# Patient Record
Sex: Male | Born: 1950 | Race: White | Hispanic: No | Marital: Married | Smoking: Former smoker
Health system: Southern US, Community
[De-identification: ages and names within clinical notes are randomized; demographics above are authoritative.]

## PROBLEM LIST (undated history)

## (undated) DIAGNOSIS — K4090 Unilateral inguinal hernia, without obstruction or gangrene, not specified as recurrent: Secondary | ICD-10-CM

## (undated) DIAGNOSIS — H269 Unspecified cataract: Secondary | ICD-10-CM

## (undated) DIAGNOSIS — F32A Depression, unspecified: Secondary | ICD-10-CM

## (undated) DIAGNOSIS — F419 Anxiety disorder, unspecified: Secondary | ICD-10-CM

## (undated) DIAGNOSIS — Z973 Presence of spectacles and contact lenses: Secondary | ICD-10-CM

## (undated) DIAGNOSIS — E079 Disorder of thyroid, unspecified: Secondary | ICD-10-CM

## (undated) DIAGNOSIS — F329 Major depressive disorder, single episode, unspecified: Secondary | ICD-10-CM

## (undated) DIAGNOSIS — E039 Hypothyroidism, unspecified: Secondary | ICD-10-CM

## (undated) DIAGNOSIS — I10 Essential (primary) hypertension: Secondary | ICD-10-CM

## (undated) DIAGNOSIS — T4145XA Adverse effect of unspecified anesthetic, initial encounter: Secondary | ICD-10-CM

## (undated) DIAGNOSIS — G4730 Sleep apnea, unspecified: Secondary | ICD-10-CM

## (undated) DIAGNOSIS — M1711 Unilateral primary osteoarthritis, right knee: Secondary | ICD-10-CM

## (undated) DIAGNOSIS — T8859XA Other complications of anesthesia, initial encounter: Secondary | ICD-10-CM

## (undated) HISTORY — PX: COLONOSCOPY: SHX5424

## (undated) HISTORY — PX: OTHER SURGICAL HISTORY: SHX169

---
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
## 1987-01-22 HISTORY — PX: KNEE ARTHROSCOPY W/ ACL RECONSTRUCTION: SHX1858

## 2009-03-17 ENCOUNTER — Encounter
Admission: RE | Admit: 2009-03-17 | Discharge: 2009-05-02 | Payer: Self-pay | Source: Home / Self Care | Admitting: Family Medicine

## 2016-05-27 DIAGNOSIS — M1711 Unilateral primary osteoarthritis, right knee: Secondary | ICD-10-CM | POA: Diagnosis not present

## 2016-05-31 DIAGNOSIS — M7022 Olecranon bursitis, left elbow: Secondary | ICD-10-CM | POA: Diagnosis not present

## 2016-05-31 DIAGNOSIS — S50312A Abrasion of left elbow, initial encounter: Secondary | ICD-10-CM | POA: Diagnosis not present

## 2016-06-11 DIAGNOSIS — F419 Anxiety disorder, unspecified: Secondary | ICD-10-CM | POA: Diagnosis not present

## 2016-06-20 DIAGNOSIS — F419 Anxiety disorder, unspecified: Secondary | ICD-10-CM | POA: Diagnosis not present

## 2016-07-11 DIAGNOSIS — F419 Anxiety disorder, unspecified: Secondary | ICD-10-CM | POA: Diagnosis not present

## 2016-07-16 DIAGNOSIS — F419 Anxiety disorder, unspecified: Secondary | ICD-10-CM | POA: Diagnosis not present

## 2016-07-22 DIAGNOSIS — F419 Anxiety disorder, unspecified: Secondary | ICD-10-CM | POA: Diagnosis not present

## 2016-08-12 DIAGNOSIS — F419 Anxiety disorder, unspecified: Secondary | ICD-10-CM | POA: Diagnosis not present

## 2016-08-13 DIAGNOSIS — F419 Anxiety disorder, unspecified: Secondary | ICD-10-CM | POA: Diagnosis not present

## 2016-08-20 DIAGNOSIS — H2513 Age-related nuclear cataract, bilateral: Secondary | ICD-10-CM | POA: Diagnosis not present

## 2016-08-27 DIAGNOSIS — F419 Anxiety disorder, unspecified: Secondary | ICD-10-CM | POA: Diagnosis not present

## 2016-09-12 DIAGNOSIS — M1711 Unilateral primary osteoarthritis, right knee: Secondary | ICD-10-CM | POA: Diagnosis not present

## 2016-09-24 DIAGNOSIS — F419 Anxiety disorder, unspecified: Secondary | ICD-10-CM | POA: Diagnosis not present

## 2016-10-02 DIAGNOSIS — M25561 Pain in right knee: Secondary | ICD-10-CM | POA: Diagnosis not present

## 2016-10-02 DIAGNOSIS — I10 Essential (primary) hypertension: Secondary | ICD-10-CM | POA: Diagnosis not present

## 2016-10-08 DIAGNOSIS — F419 Anxiety disorder, unspecified: Secondary | ICD-10-CM | POA: Diagnosis not present

## 2016-10-09 ENCOUNTER — Encounter (HOSPITAL_COMMUNITY): Payer: Self-pay | Admitting: Physician Assistant

## 2016-10-09 DIAGNOSIS — E079 Disorder of thyroid, unspecified: Secondary | ICD-10-CM | POA: Diagnosis present

## 2016-10-09 DIAGNOSIS — M1711 Unilateral primary osteoarthritis, right knee: Secondary | ICD-10-CM | POA: Diagnosis present

## 2016-10-09 DIAGNOSIS — F329 Major depressive disorder, single episode, unspecified: Secondary | ICD-10-CM | POA: Diagnosis present

## 2016-10-09 DIAGNOSIS — F32A Depression, unspecified: Secondary | ICD-10-CM | POA: Diagnosis present

## 2016-10-09 DIAGNOSIS — F419 Anxiety disorder, unspecified: Secondary | ICD-10-CM | POA: Diagnosis present

## 2016-10-09 DIAGNOSIS — I10 Essential (primary) hypertension: Secondary | ICD-10-CM | POA: Diagnosis present

## 2016-10-09 NOTE — Pre-Procedure Instructions (Signed)
    Christian Snyder  10/09/2016      [facility] [place], [place] - [address] [place] [address] Phone: [phone] Fax: [phone]    Your procedure is scheduled on October 21, 2016.  Report to [facility] Admitting at [phone] AM.  Call this number if you have problems the morning of surgery:  [phone]   Remember:  Do not eat food or drink liquids after midnight.  Take these medicines the morning of surgery with A SIP OF WATER amlodipine (norvasc), bupropion (wellbutrin), lamotrigine (lamictal), levothyroxine (synthroid).   Do not wear jewelry, make-up or nail polish.  Do not wear lotions, powders, or perfumes, or deoderant.  Men may shave face and neck.  Do not bring valuables to the hospital.  [facility] is not responsible for any belongings or valuables.  Contacts, dentures or bridgework may not be worn into surgery.  Leave your suitcase in the car.  After surgery it may be brought to your room.  For patients admitted to the hospital, discharge time will be determined by your treatment team.  Patients discharged the day of surgery will not be allowed to drive home.   Special instructions:   [place]- Preparing For Surgery  Before surgery, you can play an important role. Because skin is not sterile, your skin needs to be as free of germs as possible. You can reduce the number of germs on your skin by washing with CHG (chlorahexidine gluconate) Soap before surgery.  CHG is an antiseptic cleaner which kills germs and bonds with the skin to continue killing germs even after washing.  Please do not use if you have an allergy to CHG or antibacterial soaps. If your skin becomes reddened/irritated stop using the CHG.  Do not shave (including legs and underarms) for at least 48 hours prior to first CHG shower. It is OK to shave your face.  Please follow these instructions carefully.   1. Shower the NIGHT BEFORE  SURGERY and the MORNING OF SURGERY with CHG.   2. If you chose to wash your hair, wash your hair first as usual with your normal shampoo.  3. After you shampoo, rinse your hair and body thoroughly to remove the shampoo.  4. Use CHG as you would any other liquid soap. You can apply CHG directly to the skin and wash gently with a scrungie or a clean washcloth.   5. Apply the CHG Soap to your body ONLY FROM THE NECK DOWN.  Do not use on open wounds or open sores. Avoid contact with your eyes, ears, mouth and genitals (private parts). Wash genitals (private parts) with your normal soap.  6. Wash thoroughly, paying special attention to the area where your surgery will be performed.  7. Thoroughly rinse your body with warm water from the neck down.  8. DO NOT shower/wash with your normal soap after using and rinsing off the CHG Soap.  9. Pat yourself dry with a CLEAN TOWEL.   10. Wear CLEAN PAJAMAS   11. Place CLEAN SHEETS on your bed the night of your first shower and DO NOT SLEEP WITH PETS.    Day of Surgery: Do not apply any deodorants/lotions. Please wear clean clothes to the hospital/surgery center.     Please read over the following fact sheets that you were given. Pain Booklet, Coughing and Deep Breathing, MRSA Information and Surgical Site Infection Prevention

## 2016-10-09 NOTE — H&P (Signed)
TOTAL KNEE ADMISSION H&P  Patient is being admitted for right total knee arthroplasty.  Subjective:  Chief Complaint:right knee pain.  HPI: Christian Snyder, 66 y.o. male, has a history of pain and functional disability in the right knee due to arthritis and has failed non-surgical conservative treatments for greater than 12 weeks to includeNSAID's and/or analgesics, corticosteriod injections, viscosupplementation injections, flexibility and strengthening excercises, supervised PT with diminished ADL's post treatment, use of assistive devices, weight reduction as appropriate and activity modification.  Onset of symptoms was gradual, starting 10 years ago with gradually worsening course since that time. The patient noted prior procedures on the knee to include  arthroscopy, menisectomy and ACL reconstruction on the right knee(s).  Patient currently rates pain in the right knee(s) at 10 out of 10 with activity. Patient has night pain, worsening of pain with activity and weight bearing, pain that interferes with activities of daily living, crepitus and joint swelling.  Patient has evidence of subchondral sclerosis, periarticular osteophytes and joint space narrowing by imaging studies. There is no active infection.  Patient Active Problem List   Diagnosis Date Noted  . Essential hypertension   . Thyroid disease   . Depression   . Anxiety   . Primary localized osteoarthritis of right knee    Past Medical History:  Diagnosis Date  . Anxiety   . Depression   . Essential hypertension   . Primary localized osteoarthritis of right knee   . Thyroid disease     Past Surgical History:  Procedure Laterality Date  . KNEE ARTHROSCOPY W/ ACL RECONSTRUCTION Right 1989   4 procedures related to this   . TONSILLECTOMY  1960    No current facility-administered medications for this encounter.   Current Outpatient Prescriptions:  .  amLODipine (NORVASC) 5 MG tablet, Take 10 mg by mouth daily. , Disp: , Rfl:   .  buPROPion (WELLBUTRIN XL) 300 MG 24 hr tablet, Take 300 mg by mouth daily., Disp: , Rfl:  .  ibuprofen (ADVIL,MOTRIN) 200 MG tablet, Take 200-400 mg by mouth every 8 (eight) hours as needed for mild pain (depends on pain if takes 1-2 tablets)., Disp: , Rfl:  .  lamoTRIgine (LAMICTAL) 25 MG tablet, Take 50 mg by mouth daily., Disp: , Rfl:  .  levothyroxine (SYNTHROID, LEVOTHROID) 112 MCG tablet, Take 112 mcg by mouth daily before breakfast., Disp: , Rfl:  .  lisinopril (PRINIVIL,ZESTRIL) 40 MG tablet, Take 40 mg by mouth daily., Disp: , Rfl:  .  traZODone (DESYREL) 100 MG tablet, Take 100 mg by mouth at bedtime as needed for sleep., Disp: , Rfl:  .  Glucosamine HCl (GLUCOSAMINE PO), Take 2,000 mg by mouth daily., Disp: , Rfl:  .  Multiple Vitamin (MULTIVITAMIN WITH MINERALS) TABS tablet, Take 1 tablet by mouth daily., Disp: , Rfl:  .  Omega-3 Fatty Acids (FISH OIL) 1000 MG CAPS, Take 1,000 mg by mouth daily., Disp: , Rfl:  .  Red Yeast Rice 600 MG CAPS, Take 600 mg by mouth daily., Disp: , Rfl:  Allergies  Allergen Reactions  . Penicillins Hives    Has patient had a PCN reaction causing immediate rash, facial/tongue/throat swelling, SOB or lightheadedness with hypotension: Unknown Has patient had a PCN reaction causing severe rash involving mucus membranes or skin necrosis: Unknown Has patient had a PCN reaction that required hospitalization: Unknown Has patient had a PCN reaction occurring within the last 10 years: No If all of the above answers are "NO", then may proceed  with Cephalosporin use.     Social History  Substance Use Topics  . Smoking status: Former Smoker    Start date: 1971    Quit date: 2017  . Smokeless tobacco: Never Used  . Alcohol use Yes     Comment: 2 drinks    Family History  Problem Relation Age of Onset  . Hypertension Father      Review of Systems  Constitutional: Negative.   HENT: Positive for congestion and sinus pain.   Eyes: Negative.    Respiratory: Negative.   Cardiovascular: Negative.   Gastrointestinal: Negative.   Musculoskeletal: Positive for back pain and joint pain.  Skin: Negative.   Neurological: Negative.   Endo/Heme/Allergies: Negative.   Psychiatric/Behavioral: Negative.     Objective:  Physical Exam  Constitutional: He is oriented to person, place, and time. He appears well-developed and well-nourished.  HENT:  Head: Normocephalic and atraumatic.  Mouth/Throat: Oropharynx is clear and moist.  Eyes: Pupils are equal, round, and reactive to light. Conjunctivae are normal.  Cardiovascular: Normal rate and regular rhythm.   Respiratory: Effort normal and breath sounds normal.  GI: Soft. Bowel sounds are normal.  Genitourinary:  Genitourinary Comments: Not pertinent to current symptomatology therefore not examined.  Musculoskeletal:  Examination of his right knee reveals pain medially and laterally.  1+ synovitis.  1+ crepitation.  Previous well healed ACL incision.  Mild varus deformity.  Range of motion from 0-120 degrees.  Knee is stable with normal patella tracking.  Examination of his left knee reveals full range of motion without pain, swelling, weakness or instability.  Vascular exam: Pulses are 2+ and symmetric.  Neurologic exam: Distal motor and sensory examination is within normal limits.    Neurological: He is alert and oriented to person, place, and time.  Skin: Skin is warm and dry.  Psychiatric: He has a normal mood and affect. His behavior is normal.    Vital signs in last 24 hours: Temp:  [97.9 F (36.6 C)] 97.9 F (36.6 C) (09/19 1400) Pulse Rate:  [106] 106 (09/19 1400) BP: (182)/(95) 182/95 (09/19 1400) SpO2:  [97 %] 97 % (09/19 1400) Weight:  [99.8 kg (220 lb)] 99.8 kg (220 lb) (09/19 1400)  Labs:   Estimated body mass index is 27.5 kg/m as calculated from the following:   Height as of this encounter:  (1.905 m).   Weight as of this encounter: 99.8 kg (220  lb).   Imaging Review Plain radiographs demonstrate severe degenerative joint disease of the right knee(s). The overall alignment issignificant varus. The bone quality appears to be good for age and reported activity level.  Assessment/Plan:  End stage arthritis, right knee   The patient history, physical examination, clinical judgment of the provider and imaging studies are consistent with end stage degenerative joint disease of the right knee(s) and total knee arthroplasty is deemed medically necessary. The treatment options including medical management, injection therapy arthroscopy and arthroplasty were discussed at length. The risks and benefits of total knee arthroplasty were presented and reviewed. The risks due to aseptic loosening, infection, stiffness, patella tracking problems, thromboembolic complications and other imponderables were discussed. The patient acknowledged the explanation, agreed to proceed with the plan and consent was signed. Patient is being admitted for inpatient treatment for surgery, pain control, PT, OT, prophylactic antibiotics, VTE prophylaxis, progressive ambulation and ADL's and discharge planning. The patient is planning to be discharged home with home health services

## 2016-10-10 ENCOUNTER — Encounter (HOSPITAL_COMMUNITY)
Admission: RE | Admit: 2016-10-10 | Discharge: 2016-10-10 | Disposition: A | Discharge status: Home / Self Care | Payer: Medicare Other | Source: Ambulatory Visit | Attending: Orthopedic Surgery | Admitting: Orthopedic Surgery

## 2016-10-10 ENCOUNTER — Ambulatory Visit (HOSPITAL_COMMUNITY)
Admission: RE | Admit: 2016-10-10 | Discharge: 2016-10-10 | Disposition: A | Discharge status: Home / Self Care | Payer: Medicare Other | Source: Ambulatory Visit | Attending: Physician Assistant | Admitting: Physician Assistant

## 2016-10-10 ENCOUNTER — Encounter (HOSPITAL_COMMUNITY): Payer: Self-pay

## 2016-10-10 DIAGNOSIS — Z01818 Encounter for other preprocedural examination: Secondary | ICD-10-CM | POA: Diagnosis not present

## 2016-10-10 DIAGNOSIS — R918 Other nonspecific abnormal finding of lung field: Secondary | ICD-10-CM | POA: Diagnosis not present

## 2016-10-10 DIAGNOSIS — IMO0001 Reserved for inherently not codable concepts without codable children: Secondary | ICD-10-CM

## 2016-10-10 DIAGNOSIS — J8410 Pulmonary fibrosis, unspecified: Secondary | ICD-10-CM | POA: Diagnosis not present

## 2016-10-10 HISTORY — DX: Sleep apnea, unspecified: G47.30

## 2016-10-10 LAB — COMPREHENSIVE METABOLIC PANEL
ALT: 32 U/L (ref 17–63)
AST: 33 U/L (ref 15–41)
Albumin: 4.2 g/dL (ref 3.5–5.0)
Alkaline Phosphatase: 89 U/L (ref 38–126)
Anion gap: 8 (ref 5–15)
BILIRUBIN TOTAL: 0.6 mg/dL (ref 0.3–1.2)
BUN: 14 mg/dL (ref 6–20)
CALCIUM: 9.7 mg/dL (ref 8.9–10.3)
CHLORIDE: 104 mmol/L (ref 101–111)
CO2: 26 mmol/L (ref 22–32)
CREATININE: 1.26 mg/dL — AB (ref 0.61–1.24)
GFR, EST NON AFRICAN AMERICAN: 58 mL/min — AB (ref >60)
Glucose, Bld: 112 mg/dL — ABNORMAL HIGH (ref 65–99)
Potassium: 4 mmol/L (ref 3.5–5.1)
Sodium: 138 mmol/L (ref 135–145)
TOTAL PROTEIN: 7.7 g/dL (ref 6.5–8.1)

## 2016-10-10 LAB — CBC WITH DIFFERENTIAL/PLATELET
Basophils Absolute: 0.1 10*3/uL (ref 0.0–0.1)
Basophils Relative: 1 %
EOS PCT: 2 %
Eosinophils Absolute: 0.2 10*3/uL (ref 0.0–0.7)
HEMATOCRIT: 38.7 % — AB (ref 39.0–52.0)
Hemoglobin: 12.5 g/dL — ABNORMAL LOW (ref 13.0–17.0)
LYMPHS ABS: 4.2 10*3/uL — AB (ref 0.7–4.0)
Lymphocytes Relative: 33 %
MCH: 31.1 pg (ref 26.0–34.0)
MCHC: 32.3 g/dL (ref 30.0–36.0)
MCV: 96.3 fL (ref 78.0–100.0)
MONO ABS: 1.1 10*3/uL — AB (ref 0.1–1.0)
Monocytes Relative: 9 %
NEUTROS ABS: 7.4 10*3/uL (ref 1.7–7.7)
Neutrophils Relative %: 57 %
PLATELETS: 431 10*3/uL — AB (ref 150–400)
RBC: 4.02 MIL/uL — AB (ref 4.22–5.81)
RDW: 13.5 % (ref 11.5–15.5)
WBC: 13 10*3/uL — AB (ref 4.0–10.5)

## 2016-10-10 LAB — SURGICAL PCR SCREEN
MRSA, PCR: NEGATIVE
STAPHYLOCOCCUS AUREUS: NEGATIVE

## 2016-10-10 LAB — TYPE AND SCREEN
ABO/RH(D): O NEG
ANTIBODY SCREEN: NEGATIVE

## 2016-10-10 LAB — ABO/RH: ABO/RH(D): O NEG

## 2016-10-10 LAB — APTT: aPTT: 34 seconds (ref 24–36)

## 2016-10-10 LAB — PROTIME-INR
INR: 1.01
PROTHROMBIN TIME: 13.2 s (ref 11.4–15.2)

## 2016-10-10 NOTE — Progress Notes (Addendum)
PCP - Catha Gosselin Cardiologist - denies  Chest x-ray -10/10/16 EKG - requesting Stress Test - denies ECHO - denies Cardiac Cath - denies  Sleep Study - requesting from 3 years ago CPAP - wears at night knows to bring mask and tubing  Sending to anesthesia for review Patient is being treated for recent nasal congestion with doxycycline has about 10 days left with antibiotic. I called [facility]'s office and left message to make sure they are aware of his recent    Patient denies shortness of breath, fever, cough and chest pain at PAT appointment   Patient verbalized understanding of instructions that were given to them at the PAT appointment. Patient was also instructed that they will need to review over the PAT instructions again at home before surgery.

## 2016-10-11 LAB — URINE CULTURE: CULTURE: NO GROWTH

## 2016-11-14 DIAGNOSIS — F419 Anxiety disorder, unspecified: Secondary | ICD-10-CM | POA: Diagnosis not present

## 2016-11-20 DIAGNOSIS — M1711 Unilateral primary osteoarthritis, right knee: Secondary | ICD-10-CM | POA: Diagnosis not present

## 2016-11-20 NOTE — H&P (Signed)
TOTAL KNEE ADMISSION H&P  Patient is being admitted for right total knee arthroplasty.  Subjective:  Chief Complaint:right knee pain.  HPI: Christian Snyder, 66 y.o. male, has a history of pain and functional disability in the right knee due to arthritis and has failed non-surgical conservative treatments for greater than 12 weeks to includeNSAID's and/or analgesics, corticosteriod injections, viscosupplementation injections, flexibility and strengthening excercises, supervised PT with diminished ADL's post treatment, use of assistive devices, weight reduction as appropriate and activity modification.  Onset of symptoms was gradual, starting 10 years ago with gradually worsening course since that time. The patient noted prior procedures on the knee to include  arthroscopy, menisectomy and ACL reconstruction on the right knee(s).  Patient currently rates pain in the right knee(s) at 10 out of 10 with activity. Patient has night pain, worsening of pain with activity and weight bearing, pain that interferes with activities of daily living, crepitus and joint swelling.  Patient has evidence of subchondral sclerosis, periarticular osteophytes and joint space narrowing by imaging studies.  There is no active infection.  Patient Active Problem List   Diagnosis Date Noted  . Essential hypertension   . Thyroid disease   . Depression   . Anxiety   . Primary localized osteoarthritis of right knee    Past Medical History:  Diagnosis Date  . Anxiety   . Depression   . Essential hypertension   . Primary localized osteoarthritis of right knee   . Sleep apnea    [facility] dr. [name] wears CPAP at night  . Thyroid disease     Past Surgical History:  Procedure Laterality Date  . COLONOSCOPY     2x  . KNEE ARTHROSCOPY W/ ACL RECONSTRUCTION Right 1989   4 procedures related to this   . TONSILLECTOMY  1960    No current facility-administered medications for this encounter.    Current  Outpatient Prescriptions  Medication Sig Dispense Refill Last Dose  . amLODipine (NORVASC) 5 MG tablet Take 10 mg by mouth daily.    10/09/2016 at Unknown time  . buPROPion (WELLBUTRIN XL) 300 MG 24 hr tablet Take 300 mg by mouth daily.   10/09/2016 at Unknown time  . ibuprofen (ADVIL,MOTRIN) 200 MG tablet Take 200-400 mg by mouth every 8 (eight) hours as needed for mild pain (depends on pain if takes 1-2 tablets).     . lamoTRIgine (LAMICTAL) 25 MG tablet Take 50 mg by mouth daily.   10/09/2016 at Unknown time  . levothyroxine (SYNTHROID, LEVOTHROID) 112 MCG tablet Take 112 mcg by mouth daily before breakfast.   10/09/2016 at Unknown time  . lisinopril (PRINIVIL,ZESTRIL) 40 MG tablet Take 40 mg by mouth daily.   10/09/2016 at Unknown time  . traZODone (DESYREL) 100 MG tablet Take 100 mg by mouth at bedtime as needed for sleep.   Past Week at Unknown time  . Glucosamine HCl (GLUCOSAMINE PO) Take 2,000 mg by mouth daily.     . Multiple Vitamin (MULTIVITAMIN WITH MINERALS) TABS tablet Take 1 tablet by mouth daily.     . Omega-3 Fatty Acids (FISH OIL) 1000 MG CAPS Take 1,000 mg by mouth daily.     . Red Yeast Rice 600 MG CAPS Take 600 mg by mouth daily.      Allergies  Allergen Reactions  . Celexa [Citalopram]     Sleepiness, forgetfullness  . Penicillins Hives    Has patient had a PCN reaction causing immediate rash, facial/tongue/throat swelling, SOB or lightheadedness with  hypotension: Unknown Has patient had a PCN reaction causing severe rash involving mucus membranes or skin necrosis: Unknown Has patient had a PCN reaction that required hospitalization: Unknown Has patient had a PCN reaction occurring within the last 10 years: No If all of the above answers are "NO", then may proceed with Cephalosporin use.   . Pravastatin     Fatigue, muscle aches    Social History  Substance Use Topics  . Smoking status: Former Smoker    Start date: 1971    Quit date: 2017  . Smokeless tobacco: Never  Used  . Alcohol use Yes     Comment: occasionally    Family History  Problem Relation Age of Onset  . Hypertension Father      Review of Systems  Constitutional: Negative.   HENT: Negative.   Eyes: Negative.   Respiratory: Negative.   Cardiovascular: Negative.   Gastrointestinal: Negative.   Genitourinary: Negative.   Musculoskeletal: Positive for back pain.  Skin: Negative.   Neurological: Negative.   Endo/Heme/Allergies: Negative.   Psychiatric/Behavioral: Negative.     Objective:  Physical Exam  Constitutional: He is oriented to person, place, and time. He appears well-developed and well-nourished.  HENT:  Head: Normocephalic and atraumatic.  Mouth/Throat: Oropharynx is clear and moist.  Eyes: Pupils are equal, round, and reactive to light. Conjunctivae are normal.  Neck: Neck supple.  Cardiovascular: Normal rate and regular rhythm.   Respiratory: Effort normal and breath sounds normal.  GI: Soft. Bowel sounds are normal.  Genitourinary:  Genitourinary Comments: Not pertinent to current symptomatology therefore not examined.  Musculoskeletal:  Examination of his right knee reveals pain medially and laterally.  1+ synovitis.  1+ crepitation.  Previous well healed ACL incision.  Mild varus deformity.  Range of motion from 0-120 degrees.  Knee is stable with normal patella tracking.  Examination of his left knee reveals full range of motion without pain, swelling, weakness or instability.  Vascular exam: Pulses are   Neurological: He is alert and oriented to person, place, and time.  Skin: Skin is warm and dry.  Psychiatric: He has a normal mood and affect. His behavior is normal.    Vital signs in last 24 hours: Temp:  [98.9 F (37.2 C)] 98.9 F (37.2 C) (10/31 1400) Pulse Rate:  [76] 76 (10/31 1400) BP: (149)/(85) 149/85 (10/31 1400) SpO2:  [97 %] 97 % (10/31 1400) Weight:  [102.1 kg (225 lb)] 102.1 kg (225 lb) (10/31 1400)  Labs:   Estimated body mass index  is 28.12 kg/m as calculated from the following:   Height as of this encounter: 6\' 3"  (1.905 m).   Weight as of this encounter: 102.1 kg (225 lb).   Imaging Review Plain radiographs demonstrate severe degenerative joint disease of the right knee(s). The overall alignment issignificant varus. The bone quality appears to be good for age and reported activity level.  Assessment/Plan:  End stage arthritis, right knee  Principal Problem:   Primary localized osteoarthritis of right knee Active Problems:   Essential hypertension   Thyroid disease   Depression   Anxiety   The patient history, physical examination, clinical judgment of the provider and imaging studies are consistent with end stage degenerative joint disease of the right knee(s) and total knee arthroplasty is deemed medically necessary. The treatment options including medical management, injection therapy arthroscopy and arthroplasty were discussed at length. The risks and benefits of total knee arthroplasty were presented and reviewed. The risks due to aseptic loosening,  infection, stiffness, patella tracking problems, thromboembolic complications and other imponderables were discussed. The patient acknowledged the explanation, agreed to proceed with the plan and consent was signed. Patient is being admitted for inpatient treatment for surgery, pain control, PT, OT, prophylactic antibiotics, VTE prophylaxis, progressive ambulation and ADL's and discharge planning. The patient is planning to be discharged home with home health services

## 2016-11-21 ENCOUNTER — Encounter (HOSPITAL_COMMUNITY): Payer: Self-pay

## 2016-11-21 ENCOUNTER — Other Ambulatory Visit: Payer: Self-pay | Admitting: Orthopedic Surgery

## 2016-11-21 ENCOUNTER — Encounter (HOSPITAL_COMMUNITY)
Admission: RE | Admit: 2016-11-21 | Discharge: 2016-11-21 | Disposition: A | Discharge status: Home / Self Care | Payer: Medicare Other | Source: Ambulatory Visit | Attending: Orthopedic Surgery | Admitting: Orthopedic Surgery

## 2016-11-21 DIAGNOSIS — F329 Major depressive disorder, single episode, unspecified: Secondary | ICD-10-CM | POA: Diagnosis not present

## 2016-11-21 DIAGNOSIS — F419 Anxiety disorder, unspecified: Secondary | ICD-10-CM | POA: Insufficient documentation

## 2016-11-21 DIAGNOSIS — I10 Essential (primary) hypertension: Secondary | ICD-10-CM | POA: Diagnosis not present

## 2016-11-21 DIAGNOSIS — Z79899 Other long term (current) drug therapy: Secondary | ICD-10-CM | POA: Insufficient documentation

## 2016-11-21 DIAGNOSIS — M1711 Unilateral primary osteoarthritis, right knee: Secondary | ICD-10-CM | POA: Insufficient documentation

## 2016-11-21 DIAGNOSIS — Z01818 Encounter for other preprocedural examination: Secondary | ICD-10-CM | POA: Diagnosis not present

## 2016-11-21 DIAGNOSIS — E079 Disorder of thyroid, unspecified: Secondary | ICD-10-CM | POA: Diagnosis not present

## 2016-11-21 HISTORY — DX: Unspecified cataract: H26.9

## 2016-11-21 HISTORY — DX: Unilateral inguinal hernia, without obstruction or gangrene, not specified as recurrent: K40.90

## 2016-11-21 HISTORY — DX: Presence of spectacles and contact lenses: Z97.3

## 2016-11-21 LAB — CBC WITH DIFFERENTIAL/PLATELET
BASOS ABS: 0 10*3/uL (ref 0.0–0.1)
Basophils Relative: 0 %
Eosinophils Absolute: 0.3 10*3/uL (ref 0.0–0.7)
Eosinophils Relative: 3 %
HEMATOCRIT: 37.1 % — AB (ref 39.0–52.0)
Hemoglobin: 11.8 g/dL — ABNORMAL LOW (ref 13.0–17.0)
LYMPHS PCT: 35 %
Lymphs Abs: 3.6 10*3/uL (ref 0.7–4.0)
MCH: 30.8 pg (ref 26.0–34.0)
MCHC: 31.8 g/dL (ref 30.0–36.0)
MCV: 96.9 fL (ref 78.0–100.0)
Monocytes Absolute: 0.7 10*3/uL (ref 0.1–1.0)
Monocytes Relative: 7 %
NEUTROS PCT: 55 %
Neutro Abs: 5.7 10*3/uL (ref 1.7–7.7)
Platelets: 288 10*3/uL (ref 150–400)
RBC: 3.83 MIL/uL — AB (ref 4.22–5.81)
RDW: 14.3 % (ref 11.5–15.5)
WBC: 10.4 10*3/uL (ref 4.0–10.5)

## 2016-11-21 LAB — COMPREHENSIVE METABOLIC PANEL
ALT: 29 U/L (ref 17–63)
AST: 29 U/L (ref 15–41)
Albumin: 4.1 g/dL (ref 3.5–5.0)
Alkaline Phosphatase: 83 U/L (ref 38–126)
Anion gap: 7 (ref 5–15)
BILIRUBIN TOTAL: 0.7 mg/dL (ref 0.3–1.2)
BUN: 15 mg/dL (ref 6–20)
CHLORIDE: 104 mmol/L (ref 101–111)
CO2: 26 mmol/L (ref 22–32)
CREATININE: 1.24 mg/dL (ref 0.61–1.24)
Calcium: 9.7 mg/dL (ref 8.9–10.3)
GFR calc Af Amer: >60 mL/min (ref >60)
GFR, EST NON AFRICAN AMERICAN: 59 mL/min — AB (ref >60)
GLUCOSE: 101 mg/dL — AB (ref 65–99)
Potassium: 4.1 mmol/L (ref 3.5–5.1)
Sodium: 137 mmol/L (ref 135–145)
Total Protein: 7.4 g/dL (ref 6.5–8.1)

## 2016-11-21 LAB — PROTIME-INR
INR: 0.91
PROTHROMBIN TIME: 12.1 s (ref 11.4–15.2)

## 2016-11-21 LAB — APTT: APTT: 31 s (ref 24–36)

## 2016-11-21 LAB — SURGICAL PCR SCREEN
MRSA, PCR: NEGATIVE
STAPHYLOCOCCUS AUREUS: NEGATIVE

## 2016-11-21 LAB — TYPE AND SCREEN
ABO/RH(D): O NEG
Antibody Screen: NEGATIVE

## 2016-11-21 NOTE — Pre-Procedure Instructions (Signed)
    Curlene LabrumRonald Zeiter  11/21/2016      [facility] [place] [address] [place] [address] Phone: [phone] Fax: [phone]    Your procedure is scheduled on Monday, December 02, 2016  [facility] 8:20 A.M.  Call this number if you have problems the morning of surgery:  [phone]   Remember:  Do not eat food or drink liquids after midnight Sunday, December 01, 2016  Take these medicines the morning of surgery with A SIP OF WATER : amLODipine (NORVASC),  buPROPion (WELLBUTRIN XL), lamoTRIgine (LAMICTAL), levothyroxine (SYNTHROID) Stop taking Aspirin, vitamins,  Omega-3 Fatty Acids (FISH OIL), Red Yeast Rice, Glucosamine  and herbal medications. Do not take any NSAIDs ie: Ibuprofen, Advil, Naproxen (Aleve), Motrin, BC and Goody Powder; stop Monday, November 25, 2016.  Do not wear jewelry, make-up or nail polish.  Do not wear lotions, powders, or perfumes, or deoderant.  Do not shave 48 hours prior to surgery.  Men may shave face and neck.  Do not bring valuables to the hospital.  [facility] is not responsible for any belongings or valuables.  Contacts, dentures or bridgework may not be worn into surgery.  Leave your suitcase in the car.  After surgery it may be brought to your room. For patients admitted to the hospital, discharge time will be determined by your treatment team. Special instructions: Shower the night before surgery and the morning of surgery with CHG.  Please read over the following fact sheets that you were given. Pain Booklet, Coughing and Deep Breathing, Blood Transfusion Information, Total Joint Packet, MRSA Information and Surgical Site Infection Prevention

## 2016-11-21 NOTE — Progress Notes (Signed)
Pt denies SOB, chest pain, and being under the care of a cardiologist. Pt denies having a stress test, echo and cardiac cath. Pt denies recent labs. Anesthesia asked to review chest x ray.

## 2016-11-22 LAB — URINE CULTURE: Culture: NO GROWTH

## 2016-11-22 NOTE — Progress Notes (Signed)
Anesthesia Chart Review: Patient is a 66 year old male scheduled for right TKA on 12/02/16 by Dr. Salvatore Marvelobert Wainer. Case is posted for spinal.  History includes former smoker (quit '17), HTN, OSA (CPAP), depression, anxiety, hypothyroidism, tonsillectomy, dental implants, right inguinal hernia, osteoarthritis.  PCP is Dr. Catha GosselinKevin Little. He medically cleared patient for surgery following 10/02/16 evaluation with EKG.   Meds include amlodipine, Wellbutrin XL, Lamictal, levothyroxine, lisinopril, fish oil (on hold), red yeast rice (on hold), trazodone (not taking).  BP (!) 164/82   Pulse 72   Resp 20   Ht 6\' 3"  (1.905 m)   Wt 224 lb (101.6 kg)   SpO2 99%   BMI 28.00 kg/m    EKG 10/02/16 [facility] SR with rate variation.   CXR 9/209/18:  COMPARISON:  October 03, 2009 FINDINGS: There is a small calcified granuloma in the left upper lobe near the apex, stable. There is no edema or consolidation. Heart size and pulmonary vascularity are normal. No adenopathy. No bone lesions. IMPRESSION: Small calcified granuloma left upper lobe. No edema or Consolidation.  Preoperative labs noted. Cr 1.24, H/H 11.8/37.1. Glucose 101. PT/PTT WNL. Urine culture showed no growth.  If no acute changes then I anticipate that he can proceed as planned.  Velna Ochsllison Anaclara Acklin, PA-C [facility]Anesthesiology Phone [phone] 11/22/2016 3:52 PM

## 2016-11-29 MED ORDER — SODIUM CHLORIDE 0.9 % IV SOLN
1000.0000 mg | INTRAVENOUS | Status: AC
  Administered 2016-12-02: 1000 mg via INTRAVENOUS
  Filled 2016-11-29: qty 1100

## 2016-11-29 MED ORDER — SODIUM CHLORIDE 0.9 % IV SOLN
1500.0000 mg | INTRAVENOUS | Status: AC
  Administered 2016-12-02: 1500 mg via INTRAVENOUS
  Filled 2016-11-29: qty 1500

## 2016-12-02 ENCOUNTER — Inpatient Hospital Stay (HOSPITAL_COMMUNITY)
Admission: RE | Admit: 2016-12-02 | Discharge: 2016-12-04 | DRG: 470 | Disposition: A | Discharge status: Home / Self Care | Payer: Medicare Other | Source: Ambulatory Visit | Attending: Orthopedic Surgery | Admitting: Orthopedic Surgery

## 2016-12-02 ENCOUNTER — Inpatient Hospital Stay (HOSPITAL_COMMUNITY): Payer: Medicare Other | Admitting: Certified Registered Nurse Anesthetist

## 2016-12-02 ENCOUNTER — Encounter (HOSPITAL_COMMUNITY)
Admission: RE | Disposition: A | Discharge status: Home / Self Care | Payer: Self-pay | Source: Ambulatory Visit | Attending: Orthopedic Surgery

## 2016-12-02 ENCOUNTER — Other Ambulatory Visit: Payer: Self-pay

## 2016-12-02 ENCOUNTER — Encounter (HOSPITAL_COMMUNITY): Payer: Self-pay

## 2016-12-02 ENCOUNTER — Inpatient Hospital Stay (HOSPITAL_COMMUNITY): Payer: Medicare Other | Admitting: Vascular Surgery

## 2016-12-02 DIAGNOSIS — Z87891 Personal history of nicotine dependence: Secondary | ICD-10-CM

## 2016-12-02 DIAGNOSIS — F419 Anxiety disorder, unspecified: Secondary | ICD-10-CM | POA: Diagnosis present

## 2016-12-02 DIAGNOSIS — F418 Other specified anxiety disorders: Secondary | ICD-10-CM | POA: Diagnosis not present

## 2016-12-02 DIAGNOSIS — Z880 Allergy status to penicillin: Secondary | ICD-10-CM | POA: Diagnosis not present

## 2016-12-02 DIAGNOSIS — I10 Essential (primary) hypertension: Secondary | ICD-10-CM | POA: Diagnosis present

## 2016-12-02 DIAGNOSIS — Z888 Allergy status to other drugs, medicaments and biological substances status: Secondary | ICD-10-CM

## 2016-12-02 DIAGNOSIS — E079 Disorder of thyroid, unspecified: Secondary | ICD-10-CM | POA: Diagnosis present

## 2016-12-02 DIAGNOSIS — F329 Major depressive disorder, single episode, unspecified: Secondary | ICD-10-CM | POA: Diagnosis present

## 2016-12-02 DIAGNOSIS — Z9989 Dependence on other enabling machines and devices: Secondary | ICD-10-CM | POA: Diagnosis not present

## 2016-12-02 DIAGNOSIS — G4730 Sleep apnea, unspecified: Secondary | ICD-10-CM | POA: Diagnosis present

## 2016-12-02 DIAGNOSIS — F32A Depression, unspecified: Secondary | ICD-10-CM | POA: Diagnosis present

## 2016-12-02 DIAGNOSIS — M1711 Unilateral primary osteoarthritis, right knee: Secondary | ICD-10-CM | POA: Diagnosis present

## 2016-12-02 DIAGNOSIS — G8918 Other acute postprocedural pain: Secondary | ICD-10-CM | POA: Diagnosis not present

## 2016-12-02 HISTORY — DX: Depression, unspecified: F32.A

## 2016-12-02 HISTORY — DX: Unilateral primary osteoarthritis, right knee: M17.11

## 2016-12-02 HISTORY — DX: Major depressive disorder, single episode, unspecified: F32.9

## 2016-12-02 HISTORY — DX: Anxiety disorder, unspecified: F41.9

## 2016-12-02 HISTORY — DX: Disorder of thyroid, unspecified: E07.9

## 2016-12-02 HISTORY — DX: Essential (primary) hypertension: I10

## 2016-12-02 HISTORY — PX: TOTAL KNEE ARTHROPLASTY: SHX125

## 2016-12-02 SURGERY — ARTHROPLASTY, KNEE, TOTAL
Anesthesia: Regional | Laterality: Right

## 2016-12-02 MED ORDER — BUPIVACAINE-EPINEPHRINE 0.25% -1:200000 IJ SOLN
INTRAMUSCULAR | Status: DC | PRN
  Administered 2016-12-02: 30 mL

## 2016-12-02 MED ORDER — PHENYLEPHRINE HCL 10 MG/ML IJ SOLN
INTRAVENOUS | Status: DC | PRN
  Administered 2016-12-02: 25 ug/min via INTRAVENOUS

## 2016-12-02 MED ORDER — LEVOTHYROXINE SODIUM 112 MCG PO TABS
112.0000 ug | ORAL_TABLET | Freq: Every day | ORAL | Status: DC
  Administered 2016-12-03 – 2016-12-04 (×2): 112 ug via ORAL
  Filled 2016-12-02 (×2): qty 1

## 2016-12-02 MED ORDER — FENTANYL CITRATE (PF) 100 MCG/2ML IJ SOLN
INTRAMUSCULAR | Status: AC
  Filled 2016-12-02: qty 2

## 2016-12-02 MED ORDER — CEFUROXIME SODIUM 750 MG IJ SOLR
INTRAMUSCULAR | Status: DC | PRN
  Administered 2016-12-02: 750 mg

## 2016-12-02 MED ORDER — DOCUSATE SODIUM 100 MG PO CAPS
100.0000 mg | ORAL_CAPSULE | Freq: Two times a day (BID) | ORAL | Status: DC
  Administered 2016-12-02 – 2016-12-03 (×3): 100 mg via ORAL
  Filled 2016-12-02 (×4): qty 1

## 2016-12-02 MED ORDER — ACETAMINOPHEN 500 MG PO TABS
1000.0000 mg | ORAL_TABLET | Freq: Four times a day (QID) | ORAL | Status: AC
  Administered 2016-12-02 – 2016-12-03 (×4): 1000 mg via ORAL
  Filled 2016-12-02 (×4): qty 2

## 2016-12-02 MED ORDER — BUPIVACAINE-EPINEPHRINE (PF) 0.25% -1:200000 IJ SOLN
INTRAMUSCULAR | Status: AC
  Filled 2016-12-02: qty 30

## 2016-12-02 MED ORDER — MIDAZOLAM HCL 2 MG/2ML IJ SOLN
INTRAMUSCULAR | Status: AC
  Administered 2016-12-02: 2 mg via INTRAVENOUS
  Filled 2016-12-02: qty 2

## 2016-12-02 MED ORDER — FENTANYL CITRATE (PF) 100 MCG/2ML IJ SOLN
INTRAMUSCULAR | Status: DC | PRN
  Administered 2016-12-02: 50 ug via INTRAVENOUS
  Administered 2016-12-02 (×2): 25 ug via INTRAVENOUS

## 2016-12-02 MED ORDER — HYDROMORPHONE HCL 1 MG/ML IJ SOLN
1.0000 mg | INTRAMUSCULAR | Status: DC | PRN
  Administered 2016-12-02 – 2016-12-03 (×3): 1 mg via INTRAVENOUS
  Filled 2016-12-02 (×3): qty 1

## 2016-12-02 MED ORDER — SODIUM CHLORIDE 0.9 % IR SOLN
Status: DC | PRN
  Administered 2016-12-02: 3000 mL

## 2016-12-02 MED ORDER — LACTATED RINGERS IV SOLN
INTRAVENOUS | Status: DC
  Administered 2016-12-02: 09:00:00 via INTRAVENOUS

## 2016-12-02 MED ORDER — ONDANSETRON HCL 4 MG PO TABS: 4.0000 mg | ORAL_TABLET | Freq: Four times a day (QID) | ORAL | Status: DC | PRN

## 2016-12-02 MED ORDER — ROPIVACAINE HCL 5 MG/ML IJ SOLN
INTRAMUSCULAR | Status: DC | PRN
  Administered 2016-12-02: 30 mL via PERINEURAL

## 2016-12-02 MED ORDER — OXYCODONE HCL 5 MG PO TABS
5.0000 mg | ORAL_TABLET | ORAL | Status: DC | PRN
  Administered 2016-12-04 (×2): 5 mg via ORAL
  Filled 2016-12-02 (×2): qty 1

## 2016-12-02 MED ORDER — POTASSIUM CHLORIDE IN NACL 20-0.9 MEQ/L-% IV SOLN
INTRAVENOUS | Status: DC
  Administered 2016-12-02: 16:00:00 via INTRAVENOUS
  Filled 2016-12-02: qty 1000

## 2016-12-02 MED ORDER — DEXAMETHASONE SODIUM PHOSPHATE 10 MG/ML IJ SOLN
INTRAMUSCULAR | Status: DC | PRN
  Administered 2016-12-02: 10 mg via INTRAVENOUS

## 2016-12-02 MED ORDER — 0.9 % SODIUM CHLORIDE (POUR BTL) OPTIME
TOPICAL | Status: DC | PRN
  Administered 2016-12-02: 1000 mL

## 2016-12-02 MED ORDER — ONDANSETRON HCL 4 MG/2ML IJ SOLN
INTRAMUSCULAR | Status: DC | PRN
  Administered 2016-12-02: 4 mg via INTRAVENOUS

## 2016-12-02 MED ORDER — LAMOTRIGINE 100 MG PO TABS
50.0000 mg | ORAL_TABLET | Freq: Every day | ORAL | Status: DC
  Administered 2016-12-03 – 2016-12-04 (×2): 50 mg via ORAL
  Filled 2016-12-02 (×2): qty 1

## 2016-12-02 MED ORDER — ONDANSETRON HCL 4 MG/2ML IJ SOLN: 4.0000 mg | Freq: Once | INTRAMUSCULAR | Status: DC | PRN

## 2016-12-02 MED ORDER — ACETAMINOPHEN 325 MG PO TABS
650.0000 mg | ORAL_TABLET | ORAL | Status: DC | PRN
  Administered 2016-12-03 – 2016-12-04 (×2): 650 mg via ORAL
  Filled 2016-12-02 (×2): qty 2

## 2016-12-02 MED ORDER — BUPROPION HCL ER (XL) 150 MG PO TB24
300.0000 mg | ORAL_TABLET | Freq: Every day | ORAL | Status: DC
  Administered 2016-12-03 – 2016-12-04 (×2): 300 mg via ORAL
  Filled 2016-12-02 (×2): qty 2

## 2016-12-02 MED ORDER — ACETAMINOPHEN 650 MG RE SUPP: 650.0000 mg | RECTAL | Status: DC | PRN

## 2016-12-02 MED ORDER — CEFUROXIME SODIUM 750 MG IJ SOLR
INTRAMUSCULAR | Status: AC
  Filled 2016-12-02: qty 750

## 2016-12-02 MED ORDER — MIDAZOLAM HCL 2 MG/2ML IJ SOLN
INTRAMUSCULAR | Status: AC
  Filled 2016-12-02: qty 2

## 2016-12-02 MED ORDER — MIDAZOLAM HCL 5 MG/5ML IJ SOLN
INTRAMUSCULAR | Status: DC | PRN
  Administered 2016-12-02: 1 mg via INTRAVENOUS

## 2016-12-02 MED ORDER — MENTHOL 3 MG MT LOZG: 1.0000 | LOZENGE | OROMUCOSAL | Status: DC | PRN

## 2016-12-02 MED ORDER — AMLODIPINE BESYLATE 10 MG PO TABS
10.0000 mg | ORAL_TABLET | Freq: Every day | ORAL | Status: DC
  Administered 2016-12-04: 10 mg via ORAL
  Filled 2016-12-02: qty 1

## 2016-12-02 MED ORDER — ALUM & MAG HYDROXIDE-SIMETH 200-200-20 MG/5ML PO SUSP: 30.0000 mL | ORAL | Status: DC | PRN

## 2016-12-02 MED ORDER — METOCLOPRAMIDE HCL 5 MG PO TABS: 5.0000 mg | ORAL_TABLET | Freq: Three times a day (TID) | ORAL | Status: DC | PRN

## 2016-12-02 MED ORDER — PHENOL 1.4 % MT LIQD: 1.0000 | OROMUCOSAL | Status: DC | PRN

## 2016-12-02 MED ORDER — FENTANYL CITRATE (PF) 100 MCG/2ML IJ SOLN: 25.0000 ug | INTRAMUSCULAR | Status: DC | PRN

## 2016-12-02 MED ORDER — VANCOMYCIN HCL IN DEXTROSE 1-5 GM/200ML-% IV SOLN
1000.0000 mg | Freq: Two times a day (BID) | INTRAVENOUS | Status: AC
  Administered 2016-12-02: 1000 mg via INTRAVENOUS
  Filled 2016-12-02: qty 200

## 2016-12-02 MED ORDER — DEXAMETHASONE SODIUM PHOSPHATE 10 MG/ML IJ SOLN
10.0000 mg | Freq: Three times a day (TID) | INTRAMUSCULAR | Status: AC
  Administered 2016-12-02 – 2016-12-03 (×4): 10 mg via INTRAVENOUS
  Filled 2016-12-02 (×4): qty 1

## 2016-12-02 MED ORDER — MIDAZOLAM HCL 2 MG/2ML IJ SOLN
2.0000 mg | Freq: Once | INTRAMUSCULAR | Status: AC
  Administered 2016-12-02: 2 mg via INTRAVENOUS

## 2016-12-02 MED ORDER — PROPOFOL 10 MG/ML IV BOLUS
INTRAVENOUS | Status: DC | PRN
  Administered 2016-12-02: 30 mg via INTRAVENOUS
  Administered 2016-12-02: 65 mg via INTRAVENOUS
  Administered 2016-12-02 (×2): 20 mg via INTRAVENOUS

## 2016-12-02 MED ORDER — ASPIRIN EC 325 MG PO TBEC
325.0000 mg | DELAYED_RELEASE_TABLET | Freq: Every day | ORAL | Status: DC
  Administered 2016-12-03 – 2016-12-04 (×2): 325 mg via ORAL
  Filled 2016-12-02 (×2): qty 1

## 2016-12-02 MED ORDER — ONDANSETRON HCL 4 MG/2ML IJ SOLN: 4.0000 mg | Freq: Four times a day (QID) | INTRAMUSCULAR | Status: DC | PRN

## 2016-12-02 MED ORDER — OXYCODONE HCL 5 MG PO TABS
10.0000 mg | ORAL_TABLET | ORAL | Status: DC | PRN
  Administered 2016-12-02 – 2016-12-03 (×7): 10 mg via ORAL
  Filled 2016-12-02 (×7): qty 2

## 2016-12-02 MED ORDER — METOCLOPRAMIDE HCL 5 MG/ML IJ SOLN: 5.0000 mg | Freq: Three times a day (TID) | INTRAMUSCULAR | Status: DC | PRN

## 2016-12-02 MED ORDER — POLYETHYLENE GLYCOL 3350 17 G PO PACK
17.0000 g | PACK | Freq: Two times a day (BID) | ORAL | Status: DC
  Administered 2016-12-02 – 2016-12-03 (×2): 17 g via ORAL
  Filled 2016-12-02 (×4): qty 1

## 2016-12-02 MED ORDER — FENTANYL CITRATE (PF) 250 MCG/5ML IJ SOLN
INTRAMUSCULAR | Status: AC
  Filled 2016-12-02: qty 5

## 2016-12-02 MED ORDER — BUPIVACAINE IN DEXTROSE 0.75-8.25 % IT SOLN
INTRATHECAL | Status: DC | PRN
  Administered 2016-12-02: 1.8 mg via INTRATHECAL

## 2016-12-02 MED ORDER — LACTATED RINGERS IV SOLN
INTRAVENOUS | Status: DC | PRN
  Administered 2016-12-02 (×2): via INTRAVENOUS

## 2016-12-02 MED ORDER — DIPHENHYDRAMINE HCL 12.5 MG/5ML PO ELIX: 12.5000 mg | ORAL_SOLUTION | ORAL | Status: DC | PRN

## 2016-12-02 MED ORDER — PROPOFOL 500 MG/50ML IV EMUL
INTRAVENOUS | Status: DC | PRN
  Administered 2016-12-02: 100 ug/kg/min via INTRAVENOUS

## 2016-12-02 SURGICAL SUPPLY — 66 items
BANDAGE ESMARK 6X9 LF (GAUZE/BANDAGES/DRESSINGS) ×1 IMPLANT
BENZOIN TINCTURE PRP APPL 2/3 (GAUZE/BANDAGES/DRESSINGS) ×3 IMPLANT
BLADE SAGITTAL 25.0X1.19X90 (BLADE) ×2 IMPLANT
BLADE SAGITTAL 25.0X1.19X90MM (BLADE) ×1
BLADE SAW SGTL 13X75X1.27 (BLADE) ×3 IMPLANT
BLADE SURG 10 STRL SS (BLADE) ×12 IMPLANT
BNDG ELASTIC 6X15 VLCR STRL LF (GAUZE/BANDAGES/DRESSINGS) ×3 IMPLANT
BNDG ESMARK 6X9 LF (GAUZE/BANDAGES/DRESSINGS) ×3
BOWL SMART MIX CTS (DISPOSABLE) ×3 IMPLANT
CAPT KNEE TOTAL 3 ATTUNE ×3 IMPLANT
CEMENT HV SMART SET (Cement) ×6 IMPLANT
CLOSURE WOUND 1/2 X4 (GAUZE/BANDAGES/DRESSINGS) ×1
COVER SURGICAL LIGHT HANDLE (MISCELLANEOUS) ×3 IMPLANT
CUFF TOURNIQUET SINGLE 34IN LL (TOURNIQUET CUFF) ×3 IMPLANT
CUFF TOURNIQUET SINGLE 44IN (TOURNIQUET CUFF) IMPLANT
DECANTER SPIKE VIAL GLASS SM (MISCELLANEOUS) IMPLANT
DRAPE EXTREMITY T 121X128X90 (DRAPE) ×3 IMPLANT
DRAPE HALF SHEET 40X57 (DRAPES) ×3 IMPLANT
DRAPE INCISE IOBAN 66X45 STRL (DRAPES) ×3 IMPLANT
DRAPE U-SHAPE 47X51 STRL (DRAPES) ×3 IMPLANT
DRSG AQUACEL AG ADV 3.5X14 (GAUZE/BANDAGES/DRESSINGS) ×3 IMPLANT
DURAPREP 26ML APPLICATOR (WOUND CARE) ×6 IMPLANT
ELECT CAUTERY BLADE 6.4 (BLADE) ×3 IMPLANT
ELECT REM PT RETURN 9FT ADLT (ELECTROSURGICAL) ×3
ELECTRODE REM PT RTRN 9FT ADLT (ELECTROSURGICAL) ×1 IMPLANT
FACESHIELD WRAPAROUND (MASK) ×3 IMPLANT
GLOVE BIO SURGEON STRL SZ7 (GLOVE) ×3 IMPLANT
GLOVE BIOGEL PI IND STRL 7.0 (GLOVE) ×1 IMPLANT
GLOVE BIOGEL PI IND STRL 7.5 (GLOVE) ×1 IMPLANT
GLOVE BIOGEL PI INDICATOR 7.0 (GLOVE) ×2
GLOVE BIOGEL PI INDICATOR 7.5 (GLOVE) ×2
GLOVE SS BIOGEL STRL SZ 7.5 (GLOVE) ×1 IMPLANT
GLOVE SUPERSENSE BIOGEL SZ 7.5 (GLOVE) ×2
GOWN STRL REUS W/ TWL LRG LVL3 (GOWN DISPOSABLE) ×1 IMPLANT
GOWN STRL REUS W/ TWL XL LVL3 (GOWN DISPOSABLE) ×2 IMPLANT
GOWN STRL REUS W/TWL LRG LVL3 (GOWN DISPOSABLE) ×2
GOWN STRL REUS W/TWL XL LVL3 (GOWN DISPOSABLE) ×4
HANDPIECE INTERPULSE COAX TIP (DISPOSABLE) ×2
HOOD PEEL AWAY FACE SHEILD DIS (HOOD) ×6 IMPLANT
IMMOBILIZER KNEE 22 UNIV (SOFTGOODS) ×3 IMPLANT
KIT BASIN OR (CUSTOM PROCEDURE TRAY) ×3 IMPLANT
KIT ROOM TURNOVER OR (KITS) ×3 IMPLANT
MANIFOLD NEPTUNE II (INSTRUMENTS) ×3 IMPLANT
MARKER SKIN DUAL TIP RULER LAB (MISCELLANEOUS) ×3 IMPLANT
NEEDLE 18GX1X1/2 (RX/OR ONLY) (NEEDLE) ×3 IMPLANT
NS IRRIG 1000ML POUR BTL (IV SOLUTION) ×3 IMPLANT
PACK TOTAL JOINT (CUSTOM PROCEDURE TRAY) ×3 IMPLANT
PAD ARMBOARD 7.5X6 YLW CONV (MISCELLANEOUS) ×6 IMPLANT
SET HNDPC FAN SPRY TIP SCT (DISPOSABLE) ×1 IMPLANT
STRIP CLOSURE SKIN 1/2X4 (GAUZE/BANDAGES/DRESSINGS) ×2 IMPLANT
SUCTION FRAZIER HANDLE 10FR (MISCELLANEOUS) ×2
SUCTION TUBE FRAZIER 10FR DISP (MISCELLANEOUS) ×1 IMPLANT
SUT MNCRL AB 3-0 PS2 18 (SUTURE) ×3 IMPLANT
SUT VIC AB 0 CT1 27 (SUTURE) ×4
SUT VIC AB 0 CT1 27XBRD ANBCTR (SUTURE) ×2 IMPLANT
SUT VIC AB 1 CT1 27 (SUTURE) ×2
SUT VIC AB 1 CT1 27XBRD ANBCTR (SUTURE) ×1 IMPLANT
SUT VIC AB 2-0 CT1 27 (SUTURE) ×4
SUT VIC AB 2-0 CT1 TAPERPNT 27 (SUTURE) ×2 IMPLANT
SYR 30ML LL (SYRINGE) ×3 IMPLANT
TRAY CATH 16FR W/PLASTIC CATH (SET/KITS/TRAYS/PACK) IMPLANT
TRAY FOLEY BAG SILVER LF 14FR (CATHETERS) ×3 IMPLANT
TRAY FOLEY CATH SILVER 16FR (SET/KITS/TRAYS/PACK) IMPLANT
TUBE CONNECTING 12'X1/4 (SUCTIONS) ×2
TUBE CONNECTING 12X1/4 (SUCTIONS) ×4 IMPLANT
YANKAUER SUCT BULB TIP NO VENT (SUCTIONS) ×6 IMPLANT

## 2016-12-02 NOTE — Anesthesia Preprocedure Evaluation (Addendum)
Anesthesia Evaluation  Patient identified by MRN, date of birth, ID band Patient awake    Reviewed: Allergy & Precautions, NPO status , Patient's Chart, lab work & pertinent test results  Airway Mallampati: II  TM Distance: >3 FB Neck ROM: Full    Dental no notable dental hx.    Pulmonary sleep apnea and Continuous Positive Airway Pressure Ventilation , former smoker,    Pulmonary exam normal breath sounds clear to auscultation       Cardiovascular hypertension, Pt. on medications Normal cardiovascular exam Rhythm:Regular Rate:Normal  ECG: SR, rate 81   Neuro/Psych PSYCHIATRIC DISORDERS Anxiety Depression negative neurological ROS     GI/Hepatic negative GI ROS, Neg liver ROS,   Endo/Other  negative endocrine ROS  Renal/GU negative Renal ROS     Musculoskeletal  (+) Arthritis , Osteoarthritis,    Abdominal   Peds  Hematology  (+) anemia ,   Anesthesia Other Findings Pr-op eval per PCP (Little)  Reproductive/Obstetrics                            Anesthesia Physical Anesthesia Plan  ASA: II  Anesthesia Plan: Spinal and Regional   Post-op Pain Management:  Regional for Post-op pain   Induction: Intravenous  PONV Risk Score and Plan: 1 and Ondansetron, Dexamethasone and Propofol infusion  Airway Management Planned: Natural Airway  Additional Equipment:   Intra-op Plan:   Post-operative Plan:   Informed Consent: I have reviewed the patients History and Physical, chart, labs and discussed the procedure including the risks, benefits and alternatives for the proposed anesthesia with the patient or authorized representative who has indicated his/her understanding and acceptance.   Dental advisory given  Plan Discussed with: CRNA  Anesthesia Plan Comments:         Anesthesia Quick Evaluation

## 2016-12-02 NOTE — Op Note (Signed)
MRN:     [id number] DOB/AGE:    Aug 19, 1950 / 66 y.o.       OPERATIVE REPORT    DATE OF PROCEDURE:  12/02/2016       PREOPERATIVE DIAGNOSIS:   Primary localized OA       Estimated body mass index is 27.5 kg/m as calculated from the following:   Height as of this encounter: 6\' 3"  (1.905 m).   Weight as of this encounter: 99.8 kg (220 lb).                                                        POSTOPERATIVE DIAGNOSIS:   same                                                             PROCEDURE:  Procedure(s): TOTAL KNEE ARTHROPLASTY Using Depuy Attune RP implants #8 Femur, #9Tibia, 5mm  RP bearing, 38 Patella     SURGEON: Derec Mozingo A    ASSISTANT:  Kirstin Shepperson PA-C   (Present and scrubbed throughout the case, critical for assistance with exposure, retraction, instrumentation, and closure.)         ANESTHESIA: Spinal with Adductor Nerve Block     TOURNIQUET TIME: 90min   COMPLICATIONS:  None     SPECIMENS: None   INDICATIONS FOR PROCEDURE: The patient has  right knee DJD , varus deformities, XR shows bone on bone arthritis. Patient has failed all conservative measures including anti-inflammatory medicines, narcotics, attempts at  exercise and weight loss, cortisone injections and viscosupplementation.  Risks and benefits of surgery have been discussed, questions answered.   DESCRIPTION OF PROCEDURE: The patient identified by armband, received  right femoral nerve block and IV antibiotics, in [facility] holding [facility]. Patient taken to the operating room, appropriate anesthetic  monitors were attached Spinal anesthesia induced with  the patient in supine position, Foley catheter was inserted. Tourniquet  applied high to the operative thigh. Lateral post and foot positioner  applied to the table, the lower extremity was then prepped and draped  in usual sterile fashion from the ankle to the tourniquet. Time-out procedure was performed. The limb was wrapped  with an Esmarch bandage and the tourniquet inflated to 365 mmHg. We began the operation by making the anterior midline incision starting at handbreadth above the patella going over the patella 1 cm medial to and  4 cm distal to the tibial tubercle. Small bleeders in the skin and the  subcutaneous tissue identified and cauterized. Transverse retinaculum was incised and reflected medially and a medial parapatellar arthrotomy was accomplished. the patella was everted and theprepatellar fat pad resected. The superficial medial collateral  ligament was then elevated from anterior to posterior along the proximal  flare of the tibia and anterior half of the menisci resected. The knee was hyperflexed exposing bone on bone arthritis. Peripheral and notch osteophytes as well as the cruciate ligaments were then resected. We continued to  work our way around posteriorly along the proximal tibia, and externally  rotated the tibia subluxing it out from underneath the femur. A McHale  retractor was  placed through the notch and a lateral Hohmann retractor  placed, and we then drilled through the proximal tibia in line with the  axis of the tibia followed by an intramedullary guide rod and 2-degree  posterior slope cutting guide. The tibial cutting guide was pinned into place  allowing resection of 6 mm of bone medially and about 8 mm of bone  laterally because of her varus deformity. Satisfied with the tibial resection, we then  entered the distal femur 2 mm anterior to the PCL origin with the  intramedullary guide rod and applied the distal femoral cutting guide  set at 11mm, with 5 degrees of valgus. This was pinned along the  epicondylar axis. At this point, the distal femoral cut was accomplished without difficulty. We then sized for a #8 femoral component and pinned the guide in 3 degrees of external rotation.The chamfer cutting guide was pinned into place. The anterior, posterior, and chamfer cuts were  accomplished without difficulty followed by  the  RP box cutting guide and the box cut. We also removed posterior osteophytes from the posterior femoral condyles. At this  time, the knee was brought into full extension. We checked our  extension and flexion gaps and found them symmetric at 5mm.  The patella thickness measured at 25 mm. We set the cutting guide at 15 and removed the posterior 9.5-10 mm  of the patella sized for 38 button and drilled the lollipop. The knee  was then once again hyperflexed exposing the proximal tibia. We sized for a #9 tibial base plate, applied the smokestack and the conical reamer followed by the the Delta fin keel punch. We then hammered into place the  RP trial femoral component, inserted a 1 trial bearing, trial patellar button, and took the knee through range of motion from 0-130 degrees. No thumb pressure was required for patellar  tracking. At this point, all trial components were removed, a double batch of DePuy HV cement with 1500 mg of Zinacef was mixed and applied to all bony metallic mating surfaces except for the posterior condyles of the femur itself. In order, we  hammered into place the tibial tray and removed excess cement, the femoral component and removed excess cement, a 5mm  RP bearing  was inserted, and the knee brought to full extension with compression.  The patellar button was clamped into place, and excess cement  removed. While the cement cured the wound was irrigated out with normal saline solution pulse lavage.. Ligament stability and patellar tracking were checked and found to be excellent.. The parapatellar arthrotomy was closed with  #1 Vicryl suture. The subcutaneous tissue with 0 and 2-0 undyed  Vicryl suture, and 4-0 Monocryl.. A dressing of Aquaseal,  4 x 4, dressing sponges, Webril, and Ace wrap applied. Needle and sponge count were correct times 2.The patient awakened, extubated, and taken to recovery room without difficulty. Vascular  status was normal, pulses 2+ and symmetric.   Chey Rachels A 12/02/2016, 1:14 PM

## 2016-12-02 NOTE — Plan of Care (Signed)
  Progressing Education: Knowledge of General Education information will improve 12/02/2016 1555 - Progressing by Quentin CornwallMadison, Chez Bulnes, RN Health Behavior/Discharge Planning: Ability to manage health-related needs will improve 12/02/2016 1555 - Progressing by Quentin CornwallMadison, Faustino Luecke, RN Clinical Measurements: Ability to maintain clinical measurements within normal limits will improve 12/02/2016 1555 - Progressing by Quentin CornwallMadison, Annelle Behrendt, RN Will remain free from infection 12/02/2016 1555 - Progressing by Quentin CornwallMadison, Katurah Karapetian, RN Diagnostic test results will improve 12/02/2016 1555 - Progressing by Quentin CornwallMadison, Toula Miyasaki, RN Respiratory complications will improve 12/02/2016 1555 - Progressing by Quentin CornwallMadison, Jessie Cowher, RN Cardiovascular complication will be avoided 12/02/2016 1555 - Progressing by Quentin CornwallMadison, Dionisia Pacholski, RN Activity: Risk for activity intolerance will decrease 12/02/2016 1555 - Progressing by Quentin CornwallMadison, Anisha Starliper, RN Nutrition: Adequate nutrition will be maintained 12/02/2016 1555 - Progressing by Quentin CornwallMadison, Orchid Glassberg, RN Coping: Level of anxiety will decrease 12/02/2016 1555 - Progressing by Quentin CornwallMadison, Miamarie Moll, RN Elimination: Will not experience complications related to bowel motility 12/02/2016 1555 - Progressing by Quentin CornwallMadison, Janaria Mccammon, RN Will not experience complications related to urinary retention 12/02/2016 1555 - Progressing by Quentin CornwallMadison, Yahya Boldman, RN Pain Managment: General experience of comfort will improve 12/02/2016 1555 - Progressing by Quentin CornwallMadison, Tyrika Newman, RN Safety: Ability to remain free from injury will improve 12/02/2016 1555 - Progressing by Quentin CornwallMadison, Symon Norwood, RN Skin Integrity: Risk for impaired skin integrity will decrease 12/02/2016 1555 - Progressing by Quentin CornwallMadison, Jillian Pianka, RN

## 2016-12-02 NOTE — Anesthesia Procedure Notes (Signed)
Anesthesia Regional Block: Adductor canal block   Pre-Anesthetic Checklist: ,, timeout performed, Correct Patient, Correct Site, Correct Laterality, Correct Procedure,, site marked, risks and benefits discussed, Surgical consent,  Pre-op evaluation,  At surgeon's request and post-op pain management  Laterality: Right  Prep: chloraprep       Needles:  Injection technique: Single-shot  Needle Type: Echogenic Stimulator Needle     Needle Length: 9cm  Needle Gauge: 21     Additional Needles:   Procedures:,,,, ultrasound used (permanent image in chart),,,,  Narrative:  Start time: 12/02/2016 9:35 AM End time: 12/02/2016 9:45 AM Injection made incrementally with aspirations every 5 mL.  Performed by: Personally  Anesthesiologist: Leonides GrillsEllender, Deyana Wnuk P, MD  Additional Notes: Functioning IV was confirmed and monitors were applied.  A 90mm 21ga Arrow echogenic stimulator needle was used. Sterile prep, hand hygiene and sterile gloves were used.  Negative aspiration and negative test dose prior to incremental administration of local anesthetic. The patient tolerated the procedure well.

## 2016-12-02 NOTE — Transfer of Care (Signed)
Immediate Anesthesia Transfer of Care Note  Patient: Christian Snyder  Procedure(s) Performed: TOTAL KNEE ARTHROPLASTY (Right )  Patient Location: PACU  Anesthesia Type:Spinal and MAC  Level of Consciousness: awake, alert  and oriented  Airway & Oxygen Therapy: Patient Spontanous Breathing  Post-op Assessment: Report given to RN and Post -op Vital signs reviewed and stable  Post vital signs: Reviewed and stable  Last Vitals:  Vitals:   12/02/16 0945 12/02/16 0950  BP: (!) 143/75 132/69  Pulse: 86 80  Resp: (!) 24 20  Temp:    SpO2: 100% 100%    Last Pain:  Vitals:   12/02/16 0839  TempSrc: Oral      Patients Stated Pain Goal: 3 (46/50/35 4656)  Complications: No apparent anesthesia complications

## 2016-12-02 NOTE — Interval H&P Note (Signed)
History and Physical Interval Note:  12/02/2016 9:03 AM  Christian Snyder  has presented today for surgery, with the diagnosis of right knee DJD   The various methods of treatment have been discussed with the patient and family. After consideration of risks, benefits and other options for treatment, the patient has consented to  Procedure(s): TOTAL KNEE ARTHROPLASTY (Right) as a surgical intervention .  The patient's history has been reviewed, patient examined, no change in status, stable for surgery.  I have reviewed the patient's chart and labs.  Questions were answered to the patient's satisfaction.     Salvatore MarvelWAINER,Ife Vitelli A

## 2016-12-02 NOTE — Evaluation (Signed)
Physical Therapy Evaluation Patient Details Name: Christian Snyder MRN: [id number] DOB: 04/22/1950 Today's Date: 12/02/2016   History of Present Illness  Pt is a 66 y/o male s/p elective R TKA. PMH includes HTN, depression, anxiety, and OSA on CPAP.   Clinical Impression  Pt is s/p surgery above with deficits below. PTA, pt was independent with functional mobility. Upon eval, pt presenting with post op pain and weakness, however, tolerated ambulation very well this session. Required min guard for ambulation with RW. Reports wife will be able to assist as needed upon d/c and has all necessary DME. Follow up recommendations per MD arrangements. Will continue to follow acutely to maximize functional mobility independence and safety.     Follow Up Recommendations DC plan and follow up therapy as arranged by surgeon;Supervision for mobility/OOB    Equipment Recommendations  None recommended by PT    Recommendations for Other Services       Precautions / Restrictions Precautions Precautions: Knee Precaution Booklet Issued: Yes (comment) Precaution Comments: Reviewed supine ther ex with pt.  Required Braces or Orthoses: Knee Immobilizer - Right Knee Immobilizer - Right: Other (comment)(until discontinued ) Restrictions Weight Bearing Restrictions: Yes RLE Weight Bearing: Weight bearing as tolerated      Mobility  Bed Mobility Overal bed mobility: Needs Assistance Bed Mobility: Supine to Sit     Supine to sit: Supervision     General bed mobility comments: Supervision for safety.   Transfers Overall transfer level: Needs assistance Equipment used: Rolling walker (2 wheeled) Transfers: Sit to/from Stand Sit to Stand: Min guard         General transfer comment: Min guard for safety. Verbal cues for safe hand placement.   Ambulation/Gait Ambulation/Gait assistance: Min guard Ambulation Distance (Feet): 50 Feet Assistive device: Rolling walker (2 wheeled) Gait  Pattern/deviations: Step-to pattern;Step-through pattern;Decreased step length - right;Decreased step length - left;Antalgic;Decreased weight shift to right Gait velocity: Decreased Gait velocity interpretation: Below normal speed for age/gender General Gait Details: Slow, slightly antalgic gait. Verbal cues for sequencing using RW. Able to progress to step through gait, however, continues to demonstrate decreased weightshift to the R.   Stairs            Wheelchair Mobility    Modified Rankin (Stroke Patients Only)       Balance Overall balance assessment: Needs assistance Sitting-balance support: No upper extremity supported;Feet supported Sitting balance-Leahy Scale: Good     Standing balance support: Bilateral upper extremity supported;During functional activity Standing balance-Leahy Scale: Poor Standing balance comment: Relaint on UE for support                              Pertinent Vitals/Pain Pain Assessment: 0-10 Pain Score: 2  Pain Location: R knee  Pain Descriptors / Indicators: Constant;Aching;Operative site guarding Pain Intervention(s): Limited activity within patient's tolerance;Monitored during session;Repositioned    Home Living Family/patient expects to be discharged to:: Private residence Living Arrangements: Spouse/significant other Available Help at Discharge: Family;Available 24 hours/day Type of Home: House Home Access: Stairs to enter Entrance Stairs-Rails: Right Entrance Stairs-Number of Steps: 3 Home Layout: Two level Home Equipment: Shower seat - built in;Bedside commode;Walker - 2 wheels      Prior Function Level of Independence: Independent               Hand Dominance   Dominant Hand: Right    Extremity/Trunk Assessment   Upper Extremity Assessment Upper Extremity Assessment: Defer  to OT evaluation    Lower Extremity Assessment Lower Extremity Assessment: RLE deficits/detail RLE Deficits / Details:  Sensory in tact. Deficits consistent with post op pain and weakness. Able to perform ther ex below.     Cervical / Trunk Assessment Cervical / Trunk Assessment: Normal  Communication   Communication: No difficulties  Cognition Arousal/Alertness: Awake/alert Behavior During Therapy: WFL for tasks assessed/performed Overall Cognitive Status: Within Functional Limits for tasks assessed                                        General Comments      Exercises Total Joint Exercises Ankle Circles/Pumps: AROM;Both;20 reps Quad Sets: AROM;Right;10 reps Towel Squeeze: AROM;Both;10 reps Short Arc Quad: AROM;Right;10 reps Heel Slides: AROM;Right;10 reps Hip ABduction/ADduction: AROM;Right;10 reps   Assessment/Plan    PT Assessment Patient needs continued PT services  PT Problem List Decreased strength;Decreased range of motion;Decreased balance;Decreased knowledge of use of DME;Pain       PT Treatment Interventions DME instruction;Stair training;Gait training;Functional mobility training;Therapeutic activities;Therapeutic exercise;Balance training;Neuromuscular re-education;Patient/family education    PT Goals (Current goals can be found in the Care Plan section)  Acute Rehab PT Goals Patient Stated Goal: to go home  PT Goal Formulation: With patient Time For Goal Achievement: 12/09/16 Potential to Achieve Goals: Good    Frequency 7X/week   Barriers to discharge        Co-evaluation               AM-PAC PT "6 Clicks" Daily Activity  Outcome Measure Difficulty turning over in bed (including adjusting bedclothes, sheets and blankets)?: None Difficulty moving from lying on back to sitting on the side of the bed? : None Difficulty sitting down on and standing up from a chair with arms (e.g., wheelchair, bedside commode, etc,.)?: Unable Help needed moving to and from a bed to chair (including a wheelchair)?: A Little Help needed walking in hospital room?: A  Little Help needed climbing 3-5 steps with a railing? : A Little 6 Click Score: 18    End of Session Equipment Utilized During Treatment: Gait belt;Right knee immobilizer Activity Tolerance: Patient tolerated treatment well Patient left: in chair;with call bell/phone within reach Nurse Communication: Mobility status PT Visit Diagnosis: Other abnormalities of gait and mobility (R26.89);Pain Pain - Right/Left: Right Pain - part of body: Knee    Time: [id number] PT Time Calculation (min) (ACUTE ONLY): 31 min   Charges:   PT Evaluation $PT Eval Low Complexity: 1 Low PT Treatments $Gait Training: 8-22 mins   PT G Codes:        Gladys DammeBrittany Dorothye Berni, PT, DPT  Acute Rehabilitation Services  Pager: [phone]   Lehman PromBrittany S Saliha Salts 12/02/2016, 6:48 PM

## 2016-12-02 NOTE — Anesthesia Procedure Notes (Signed)
Spinal  Patient location during procedure: OR Start time: 12/02/2016 11:15 AM End time: 12/02/2016 11:25 AM Staffing Anesthesiologist: Leonides GrillsEllender, Ryan P, MD Performed: anesthesiologist  Preanesthetic Checklist Completed: patient identified, surgical consent, pre-op evaluation, timeout performed, IV checked, risks and benefits discussed and monitors and equipment checked Spinal Block Patient position: sitting Prep: DuraPrep Patient monitoring: cardiac monitor, continuous pulse ox and blood pressure Approach: midline Location: L4-5 Injection technique: single-shot Needle Needle type: Pencan  Needle gauge: 24 G Needle length: 9 cm Assessment Sensory level: T10 Additional Notes Functioning IV was confirmed and monitors were applied. Sterile prep and drape, including hand hygiene and sterile gloves were used. The patient was positioned and the spine was prepped. The skin was anesthetized with lidocaine.  Free flow of clear CSF was obtained prior to injecting local anesthetic into the CSF.  The spinal needle aspirated freely following injection.  The needle was carefully withdrawn.  The patient tolerated the procedure well.

## 2016-12-02 NOTE — Progress Notes (Signed)
Orthopedic Tech Progress Note Patient Details:  Christian LabrumRonald Cerullo August 10, 1950 [id number]  CPM Right Knee CPM Right Knee: On Right Knee Flexion (Degrees): 90 Right Knee Extension (Degrees): 0 Additional Comments: CPM and Bone foam provided.  Right knee/leg.   Alvina ChouWilliams, Shervin Cypert C 12/02/2016, 3:30 PM

## 2016-12-02 NOTE — Anesthesia Postprocedure Evaluation (Signed)
Anesthesia Post Note  Patient: Christian Snyder  Procedure(s) Performed: TOTAL KNEE ARTHROPLASTY (Right )     Patient location during evaluation: PACU Anesthesia Type: Regional Level of consciousness: awake and alert and patient cooperative Pain management: pain level controlled Vital Signs Assessment: post-procedure vital signs reviewed and stable Respiratory status: spontaneous breathing and respiratory function stable Cardiovascular status: stable Anesthetic complications: no    Last Vitals:  Vitals:   12/02/16 1457 12/02/16 1523  BP:  (!) 151/84  Pulse: 64 77  Resp: 12 18  Temp: (!) 36.1 C 36.5 C  SpO2: 99%     Last Pain:  Vitals:   12/02/16 1546  TempSrc:   PainSc: 7                  Nichalas Coin DAVID

## 2016-12-03 ENCOUNTER — Encounter (HOSPITAL_COMMUNITY): Payer: Self-pay | Admitting: Orthopedic Surgery

## 2016-12-03 LAB — BASIC METABOLIC PANEL
ANION GAP: 7 (ref 5–15)
BUN: 11 mg/dL (ref 6–20)
CHLORIDE: 102 mmol/L (ref 101–111)
CO2: 26 mmol/L (ref 22–32)
Calcium: 8.9 mg/dL (ref 8.9–10.3)
Creatinine, Ser: 1.09 mg/dL (ref 0.61–1.24)
Glucose, Bld: 143 mg/dL — ABNORMAL HIGH (ref 65–99)
POTASSIUM: 4.3 mmol/L (ref 3.5–5.1)
SODIUM: 135 mmol/L (ref 135–145)

## 2016-12-03 LAB — CBC
HCT: 30.2 % — ABNORMAL LOW (ref 39.0–52.0)
HEMOGLOBIN: 9.8 g/dL — AB (ref 13.0–17.0)
MCH: 31.4 pg (ref 26.0–34.0)
MCHC: 32.5 g/dL (ref 30.0–36.0)
MCV: 96.8 fL (ref 78.0–100.0)
PLATELETS: 307 10*3/uL (ref 150–400)
RBC: 3.12 MIL/uL — AB (ref 4.22–5.81)
RDW: 14.3 % (ref 11.5–15.5)
WBC: 14.3 10*3/uL — AB (ref 4.0–10.5)

## 2016-12-03 MED ORDER — POLYETHYLENE GLYCOL 3350 17 G PO PACK: PACK | ORAL | 0 refills | Status: DC

## 2016-12-03 MED ORDER — ASPIRIN 325 MG PO TBEC: DELAYED_RELEASE_TABLET | ORAL | 0 refills | Status: DC

## 2016-12-03 MED ORDER — DOCUSATE SODIUM 100 MG PO CAPS: ORAL_CAPSULE | ORAL | 0 refills | Status: DC

## 2016-12-03 MED ORDER — OXYCODONE HCL 10 MG PO TABS: ORAL_TABLET | ORAL | 0 refills | Status: DC

## 2016-12-03 MED ORDER — ACETAMINOPHEN 325 MG PO TABS: 650.0000 mg | ORAL_TABLET | ORAL | Status: AC | PRN

## 2016-12-03 NOTE — Progress Notes (Signed)
Physical Therapy Treatment Patient Details Name: Christian Snyder MRN: [id number] DOB: Aug 19, 1950 Today's Date: 12/03/2016    History of Present Illness Pt is a 66 y/o male s/p elective R TKA. PMH includes HTN, depression, anxiety, and OSA on CPAP.     PT Comments    Pt is POD #1 and progressing well with his mobility, ambulating the hallway with RW and supervision.  HEP exercises reviewed and plan to start stair training this PM as well as continued HEP review.     Follow Up Recommendations  DC plan and follow up therapy as arranged by surgeon;Supervision for mobility/OOB     Equipment Recommendations  None recommended by PT    Recommendations for Other Services   NA     Precautions / Restrictions Precautions Precautions: Knee Precaution Booklet Issued: Yes (comment) Precaution Comments: Reviewed supine ther ex with pt.  Required Braces or Orthoses: Knee Immobilizer - Right Knee Immobilizer - Right: Other (comment)(until discontinued) Restrictions Weight Bearing Restrictions: Yes RLE Weight Bearing: Weight bearing as tolerated    Mobility  Bed Mobility Overal bed mobility: Needs Assistance Bed Mobility: Supine to Sit     Supine to sit: Supervision     General bed mobility comments: supervision for safety.   Transfers Overall transfer level: Needs assistance Equipment used: Rolling walker (2 wheeled) Transfers: Sit to/from Stand Sit to Stand: Supervision         General transfer comment: supervision for safety  Ambulation/Gait Ambulation/Gait assistance: Supervision Ambulation Distance (Feet): 130 Feet Assistive device: Rolling walker (2 wheeled) Gait Pattern/deviations: Step-through pattern;Antalgic Gait velocity: decreased Gait velocity interpretation: Below normal speed for age/gender General Gait Details: Pt with mildly antalgic gait pattern, verbal cues for more upright posture.           Balance Overall balance assessment: Needs  assistance Sitting-balance support: Feet supported;No upper extremity supported Sitting balance-Leahy Scale: Good     Standing balance support: No upper extremity supported;Bilateral upper extremity supported;Single extremity supported Standing balance-Leahy Scale: Fair                              Cognition Arousal/Alertness: Awake/alert Behavior During Therapy: WFL for tasks assessed/performed Overall Cognitive Status: Within Functional Limits for tasks assessed                                        Exercises Total Joint Exercises Ankle Circles/Pumps: AROM;Both;20 reps Quad Sets: AROM;Right;10 reps Towel Squeeze: AROM;Both;10 reps Heel Slides: AAROM;Right;10 reps Goniometric ROM: 8-75        Pertinent Vitals/Pain Pain Assessment: 0-10 Pain Score: 7  Pain Location: right knee Pain Descriptors / Indicators: Constant;Aching;Operative site guarding Pain Intervention(s): Limited activity within patient's tolerance;Monitored during session;Repositioned;Ice applied           PT Goals (current goals can now be found in the care plan section) Acute Rehab PT Goals Patient Stated Goal: to go home  Progress towards PT goals: Progressing toward goals    Frequency    7X/week      PT Plan Current plan remains appropriate       AM-PAC PT "6 Clicks" Daily Activity  Outcome Measure  Difficulty turning over in bed (including adjusting bedclothes, sheets and blankets)?: None Difficulty moving from lying on back to sitting on the side of the bed? : None Difficulty sitting down on and standing  up from a chair with arms (e.g., wheelchair, bedside commode, etc,.)?: None Help needed moving to and from a bed to chair (including a wheelchair)?: A Little Help needed walking in hospital room?: A Little Help needed climbing 3-5 steps with a railing? : A Little 6 Click Score: 21    End of Session Equipment Utilized During Treatment: Gait belt;Right  knee immobilizer Activity Tolerance: Patient limited by pain Patient left: in chair;with call bell/phone within reach   PT Visit Diagnosis: Other abnormalities of gait and mobility (R26.89);Pain Pain - Right/Left: Right Pain - part of body: Knee     Time: [id number] PT Time Calculation (min) (ACUTE ONLY): 25 min  Charges:  $Gait Training: 8-22 mins $Therapeutic Exercise: 8-22 mins          Lexie Koehl B. Renald Haithcock, PT, DPT [phone]            12/03/2016, 10:54 AM

## 2016-12-03 NOTE — Progress Notes (Signed)
Physical Therapy Treatment Patient Details Name: Christian Snyder MRN: [id number] DOB: 02-24-1950 Today's Date: 12/03/2016    History of Present Illness Pt is a 66 y/o male s/p elective R TKA. PMH includes HTN, depression, anxiety, and OSA on CPAP.     PT Comments    Pt is POD #1 and this is his second session.  We practices stairs using several different methods to ensure safety in any stair situation around his home.  He did well without his KI this PM and we worked on normalizing his sit to stand transition and his gait pattern.  He is progressing with his HEP program and needs to review seated knee flexion exercises next session.  PT will continue to follow acutely to progress mobility.  Dr. Sherene SiresWainer's PA, Kirsten asked for us to try cane with gait tomorrow.    Follow Up Recommendations  DC plan and follow up therapy as arranged by surgeon;Supervision for mobility/OOB     Equipment Recommendations  None recommended by PT    Recommendations for Other Services   NA     Precautions / Restrictions Precautions Precautions: Knee Precaution Booklet Issued: Yes (comment) Precaution Comments: Reviewed no pillow under knee Required Braces or Orthoses: (d/c this session) Restrictions Weight Bearing Restrictions: Yes RLE Weight Bearing: Weight bearing as tolerated    Mobility  Bed Mobility               General bed mobility comments: pt OOB in recliner upon arrival  Transfers Overall transfer level: Needs assistance Equipment used: Rolling walker (2 wheeled) Transfers: Sit to/from Stand Sit to Stand: Supervision         General transfer comment: supervision for safety  Ambulation/Gait Ambulation/Gait assistance: Supervision Ambulation Distance (Feet): 75 Feet Assistive device: Rolling walker (2 wheeled) Gait Pattern/deviations: Step-through pattern;Antalgic Gait velocity: decreased Gait velocity interpretation: Below normal speed for age/gender General Gait Details: Pt  doing better practicing smoothness of gait with moderately antalgic pattern, no signs of buckling without KI this sesssion cues for gait sequencing.  PA moved RW up to better fit pt's height which I meant to do earlier, so I am glad she made that adjustment.    Stairs Stairs: Yes   Stair Management: No rails;One rail Right;Two rails;Step to pattern;Forwards;With cane Number of Stairs: 15 General stair comments: practiced with both rails, one rail and cane, and hand held assist and cane simulating several different stair situations that pt has at home.          Balance Overall balance assessment: Needs assistance Sitting-balance support: Feet supported;No upper extremity supported Sitting balance-Leahy Scale: Good     Standing balance support: No upper extremity supported Standing balance-Leahy Scale: Fair                              Cognition Arousal/Alertness: Awake/alert Behavior During Therapy: WFL for tasks assessed/performed Overall Cognitive Status: Within Functional Limits for tasks assessed                                        Exercises Total Joint Exercises Short Arc Quad: AROM;Right;10 reps Heel Slides: AAROM;Right;10 reps Hip ABduction/ADduction: AROM;Right;10 reps Straight Leg Raises: AROM;Right;10 reps Long Arc Quad: AROM;Right;10 reps        Pertinent Vitals/Pain Pain Assessment: 0-10 Pain Score: 7  Pain Location: right knee Pain Descriptors / Indicators:  Aching;Operative site guarding;Sore Pain Intervention(s): Limited activity within patient's tolerance;Monitored during session;Repositioned;Ice applied    Home Living Family/patient expects to be discharged to:: Private residence Living Arrangements: Spouse/significant other Available Help at Discharge: Family;Available 24 hours/day Type of Home: House Home Access: Stairs to enter Entrance Stairs-Rails: Right Home Layout: Two level Home Equipment: Shower seat -  built in;Bedside commode;Walker - 2 wheels      Prior Function Level of Independence: Independent          PT Goals (current goals can now be found in the care plan section) Acute Rehab PT Goals Patient Stated Goal: to go home  Progress towards PT goals: Progressing toward goals    Frequency    7X/week      PT Plan Current plan remains appropriate       AM-PAC PT "6 Clicks" Daily Activity  Outcome Measure  Difficulty turning over in bed (including adjusting bedclothes, sheets and blankets)?: None Difficulty moving from lying on back to sitting on the side of the bed? : None Difficulty sitting down on and standing up from a chair with arms (e.g., wheelchair, bedside commode, etc,.)?: None Help needed moving to and from a bed to chair (including a wheelchair)?: None Help needed walking in hospital room?: None Help needed climbing 3-5 steps with a railing? : A Little 6 Click Score: 23    End of Session Equipment Utilized During Treatment: Gait belt Activity Tolerance: Patient limited by pain Patient left: in chair;with call bell/phone within reach Nurse Communication: Patient requests pain meds PT Visit Diagnosis: Other abnormalities of gait and mobility (R26.89);Pain Pain - Right/Left: Right Pain - part of body: Knee     Time: 1455-1520 PT Time Calculation (min) (ACUTE ONLY): 25 min  Charges:  $Gait Training: 8-22 mins $Therapeutic Exercise: 8-22 mins          Christipher Rieger B. Kevork Joyce, PT, DPT [phone]            12/03/2016, 3:28 PM

## 2016-12-03 NOTE — Progress Notes (Signed)
Subjective: 1 Day Post-Op Procedure(s) (LRB): TOTAL KNEE ARTHROPLASTY (Right) Patient reports pain as 6 on 0-10 scale.    Objective: Vital signs in last 24 hours: Temp:  [97 F (36.1 C)-98.3 F (36.8 C)] 97.9 F (36.6 C) (11/13 0510) Pulse Rate:  [59-95] 69 (11/13 0510) Resp:  [10-18] 16 (11/13 0510) BP: (121-151)/(69-84) 134/84 (11/13 0510) SpO2:  [94 %-100 %] 97 % (11/13 0510)  Intake/Output from previous day: 11/12 0701 - 11/13 0700 In: 1800 [I.V.:1800] Out: 1250 [Urine:1200; Blood:50] Intake/Output this shift: No intake/output data recorded.  Recent Labs    12/03/16 0458  HGB 9.8*   Recent Labs    12/03/16 0458  WBC 14.3*  RBC 3.12*  HCT 30.2*  PLT 307   Recent Labs    12/03/16 0458  NA 135  K 4.3  CL 102  CO2 26  BUN 11  CREATININE 1.09  GLUCOSE 143*  CALCIUM 8.9   No results for input(s): LABPT, INR in the last 72 hours.  ABD soft Neurovascular intact Sensation intact distally Intact pulses distally Dorsiflexion/Plantar flexion intact Incision: dressing C/D/I and no drainage  Assessment/Plan: 1 Day Post-Op Procedure(s) (LRB): TOTAL KNEE ARTHROPLASTY (Right)  Principal Problem:   Primary localized osteoarthritis of right knee Active Problems:   Essential hypertension   Thyroid disease   Depression   Anxiety I saw this patient early this am and he was struggling with pain.  He is under much better control and did well with ambulation with me this afternoon.  He is tolerating the zero degree knee much better this afternoon.  Will plan on D/c in AM and he starts outpatient PT on Thursday 11/15. Advance diet Up with therapy D/C IV fluids Plan for discharge tomorrow  Christian Snyder,Christian Snyder 12/03/2016, 2:13 PM

## 2016-12-03 NOTE — Evaluation (Signed)
Occupational Therapy Evaluation Patient Details Name: Christian Snyder MRN: [id number] DOB: 02-14-1950 Today's Date: 12/03/2016    History of Present Illness Pt is a 66 y/o male s/p elective R TKA. PMH includes HTN, depression, anxiety, and OSA on CPAP.    Clinical Impression   Pt is at min A - min guard A level with LB ADLs and sup with ADL mobility. Pt will have assist at home from his wife and has all necessary DME and A/E for home. All education completed and no further acute OT is indicated at this time    Follow Up Recommendations  DC plan and follow up therapy as arranged by surgeon    Equipment Recommendations  3 in 1 bedside commode    Recommendations for Other Services       Precautions / Restrictions Precautions Precautions: Knee Precaution Booklet Issued: Yes (comment) Precaution Comments: Reviewed no pillow under knee Required Braces or Orthoses: Knee Immobilizer - Right Knee Immobilizer - Right: Other (comment)(until discontinued) Restrictions Weight Bearing Restrictions: Yes RLE Weight Bearing: Weight bearing as tolerated      Mobility Bed Mobility Overal bed mobility: Needs Assistance Bed Mobility: Supine to Sit     Supine to sit: Supervision     General bed mobility comments: pt OOB in recliner upon arrival  Transfers Overall transfer level: Needs assistance Equipment used: Rolling walker (2 wheeled) Transfers: Sit to/from Stand Sit to Stand: Supervision         General transfer comment: supervision for safety    Balance Overall balance assessment: Needs assistance Sitting-balance support: Feet supported;No upper extremity supported Sitting balance-Leahy Scale: Good     Standing balance support: No upper extremity supported;Bilateral upper extremity supported;Single extremity supported Standing balance-Leahy Scale: Fair                             ADL either performed or assessed with clinical judgement   ADL Overall ADL's :  Needs assistance/impaired     Grooming: Wash/dry hands;Wash/dry face;Standing   Upper Body Bathing: Set up   Lower Body Bathing: Minimal assistance   Upper Body Dressing : Set up   Lower Body Dressing: Minimal assistance   Toilet Transfer: Supervision/safety;RW;Comfort height toilet;Grab bars;Ambulation   Toileting- Clothing Manipulation and Hygiene: Min guard   Tub/ Shower Transfer: Supervision/safety;3 in 1;Ambulation;Grab bars;Shower seat;Rolling walker   Functional mobility during ADLs: Supervision/safety       Vision Baseline Vision/History: Wears glasses Wears Glasses: Reading only Patient Visual Report: No change from baseline       Perception     Praxis      Pertinent Vitals/Pain Pain Assessment: 0-10 Pain Score: 2  Pain Location: right knee Pain Descriptors / Indicators: Aching;Operative site guarding;Sore Pain Intervention(s): Monitored during session;Repositioned;Ice applied     Hand Dominance Right   Extremity/Trunk Assessment Upper Extremity Assessment Upper Extremity Assessment: Overall WFL for tasks assessed   Lower Extremity Assessment Lower Extremity Assessment: Defer to PT evaluation   Cervical / Trunk Assessment Cervical / Trunk Assessment: Normal   Communication Communication Communication: No difficulties   Cognition Arousal/Alertness: Awake/alert Behavior During Therapy: WFL for tasks assessed/performed Overall Cognitive Status: Within Functional Limits for tasks assessed                                     General Comments   pt pleasant and cooperative  Exercises    Shoulder Instructions      Home Living Family/patient expects to be discharged to:: Private residence Living Arrangements: Spouse/significant other Available Help at Discharge: Family;Available 24 hours/day Type of Home: House Home Access: Stairs to enter [employer] CorporationEntrance Stairs-Number of Steps: 3 Entrance Stairs-Rails: Right Home Layout: Two  level Alternate Level Stairs-Number of Steps: flight  Alternate Level Stairs-Rails: Left;Right Bathroom Shower/Tub: Tub/shower unit;Walk-in shower   Bathroom Toilet: Handicapped height     Home Equipment: Shower seat - built in;Bedside commode;Walker - 2 wheels          Prior Functioning/Environment Level of Independence: Independent                 OT Problem List: Decreased activity tolerance;Decreased knowledge of use of DME or AE;Impaired balance (sitting and/or standing);Pain      OT Treatment/Interventions:      OT Goals(Current goals can be found in the care plan section) Acute Rehab OT Goals Patient Stated Goal: to go home  OT Goal Formulation: With patient Potential to Achieve Goals: Good  OT Frequency:     Barriers to D/C:    no barriers       Co-evaluation              AM-PAC PT "6 Clicks" Daily Activity     Outcome Measure Help from another person eating meals?: None Help from another person taking care of personal grooming?: A Little Help from another person toileting, which includes using toliet, bedpan, or urinal?: A Little Help from another person bathing (including washing, rinsing, drying)?: A Little Help from another person to put on and taking off regular upper body clothing?: A Little Help from another person to put on and taking off regular lower body clothing?: A Little 6 Click Score: 19   End of Session Equipment Utilized During Treatment: Gait belt;Rolling walker;Other (comment)(3 in 1) CPM Right Knee CPM Right Knee: Off  Activity Tolerance: Patient tolerated treatment well Patient left: in chair;with call bell/phone within reach  OT Visit Diagnosis: Unsteadiness on feet (R26.81);Muscle weakness (generalized) (M62.81);Pain Pain - Right/Left: Right Pain - part of body: Knee                Time: 1231-1255 OT Time Calculation (min): 24 min Charges:  OT General Charges $OT Visit: 1 Visit OT Evaluation $OT Eval Low  Complexity: 1 Low OT Treatments $Therapeutic Activity: 8-22 mins G-Codes: OT G-codes **NOT FOR INPATIENT CLASS** Functional Assessment Tool Used: AM-PAC 6 Clicks Daily Activity     Galen ManilaSpencer, Marlaina Coburn Jeanette 12/03/2016, 1:54 PM

## 2016-12-03 NOTE — Care Management Note (Addendum)
Case Management Note  Patient Details  Name: Christian Snyder MRN: [id number] Date of Birth: 22-Sep-1950  Subjective/Objective:    Right TKA                Action/Plan: Discharge Planning:  NCM spoke to pt and states he has RW at home. [employer] will provide CPM at dc and deliver to his home. [facility]. Appt scheduled on 12/05/2016 at 130 pm. Pt has appt card for Outpt PT.   PCP Catha GosselinLittle, Kevin MD  Expected Discharge Date:               Expected Discharge Plan:  OP Rehab  In-House Referral:  NA  Discharge planning Services  CM Consult  Post Acute Care Choice:  NA Choice offered to:  NA  DME Arranged:  CPM DME Agency:  [employer]  HH Arranged:  NA HH Agency:  NA  Status of Service:  Completed, signed off  If discussed at Long Length of Stay Meetings, dates discussed:    Additional Comments:  Elliot CousinShavis, Braelin Costlow Ellen, RN 12/03/2016, 2:22 PM

## 2016-12-04 LAB — BASIC METABOLIC PANEL
ANION GAP: 5 (ref 5–15)
BUN: 14 mg/dL (ref 6–20)
CALCIUM: 9 mg/dL (ref 8.9–10.3)
CO2: 26 mmol/L (ref 22–32)
Chloride: 105 mmol/L (ref 101–111)
Creatinine, Ser: 1.11 mg/dL (ref 0.61–1.24)
GFR calc Af Amer: >60 mL/min (ref >60)
GLUCOSE: 132 mg/dL — AB (ref 65–99)
POTASSIUM: 4 mmol/L (ref 3.5–5.1)
SODIUM: 136 mmol/L (ref 135–145)

## 2016-12-04 LAB — CBC
HEMATOCRIT: 28.1 % — AB (ref 39.0–52.0)
HEMOGLOBIN: 9.2 g/dL — AB (ref 13.0–17.0)
MCH: 31.5 pg (ref 26.0–34.0)
MCHC: 32.7 g/dL (ref 30.0–36.0)
MCV: 96.2 fL (ref 78.0–100.0)
PLATELETS: 260 10*3/uL (ref 150–400)
RBC: 2.92 MIL/uL — AB (ref 4.22–5.81)
RDW: 14.5 % (ref 11.5–15.5)
WBC: 18.9 10*3/uL — AB (ref 4.0–10.5)

## 2016-12-04 NOTE — Progress Notes (Signed)
Physical Therapy Treatment Patient Details Name: Christian LabrumRonald Reichow MRN: [id number] DOB: [birth year] Today's Date: 12/04/2016    History of Present Illness Pt is a 66 y/o male s/p elective R TKA. PMH includes HTN, depression, anxiety, and OSA on CPAP.     PT Comments    Pt is progressing well with gait and mobility.  He is a little more unstable with cane use and feels the need to reach for stability with his free hand, but did better the further we walked.  He demonstrated what he learned yesterday on the stairs today without cues and finished reviewing his HEP program. He is due to d/c this PM.    Follow Up Recommendations  DC plan and follow up therapy as arranged by surgeon;Supervision for mobility/OOB     Equipment Recommendations  None recommended by PT    Recommendations for Other Services   NA     Precautions / Restrictions Precautions Precautions: Knee Precaution Booklet Issued: Yes (comment) Precaution Comments: Reviewed no pillow under knee Required Braces or Orthoses: (d/c yesterday) Restrictions RLE Weight Bearing: Weight bearing as tolerated    Mobility  Bed Mobility               General bed mobility comments: Pt is OOB in recliner chair.   Transfers Overall transfer level: Needs assistance Equipment used: Rolling walker (2 wheeled) Transfers: Sit to/from Stand Sit to Stand: Supervision         General transfer comment: supervision for safety since we were trying cane for gait this time.   Ambulation/Gait Ambulation/Gait assistance: Min guard Ambulation Distance (Feet): 75 Feet Assistive device: Straight cane Gait Pattern/deviations: Step-to pattern;Antalgic Gait velocity: decreased Gait velocity interpretation: Below normal speed for age/gender General Gait Details: Min guard assist with cane with verbal cues for safe cane use.  Pt feeling the need to reach for stability with his free right hand, but improved with practice.    Stairs Stairs:  Yes   Stair Management: One rail Right;Forwards;With cane Number of Stairs: 5 General stair comments: Pt able to demonstrate back what he learned yesterday with his cane supervision for safety.          Balance Overall balance assessment: Needs assistance Sitting-balance support: Feet supported Sitting balance-Leahy Scale: Good     Standing balance support: Single extremity supported Standing balance-Leahy Scale: Fair                              Cognition Arousal/Alertness: Awake/alert Behavior During Therapy: WFL for tasks assessed/performed Overall Cognitive Status: Within Functional Limits for tasks assessed                                        Exercises Total Joint Exercises Knee Flexion: AAROM;AROM;Right;10 reps Goniometric ROM: 5-80        Pertinent Vitals/Pain Pain Assessment: 0-10 Pain Score: 5  Pain Location: right knee Pain Descriptors / Indicators: Aching;Operative site guarding;Sore Pain Intervention(s): Monitored during session;Limited activity within patient's tolerance;Repositioned           PT Goals (current goals can now be found in the care plan section) Acute Rehab PT Goals Patient Stated Goal: to go home  Progress towards PT goals: Progressing toward goals    Frequency    7X/week      PT Plan Current plan remains appropriate  AM-PAC PT "6 Clicks" Daily Activity  Outcome Measure  Difficulty turning over in bed (including adjusting bedclothes, sheets and blankets)?: None Difficulty moving from lying on back to sitting on the side of the bed? : None Difficulty sitting down on and standing up from a chair with arms (e.g., wheelchair, bedside commode, etc,.)?: None Help needed moving to and from a bed to chair (including a wheelchair)?: None Help needed walking in hospital room?: A Little Help needed climbing 3-5 steps with a railing? : None 6 Click Score: 23    End of Session Equipment  Utilized During Treatment: Gait belt Activity Tolerance: Patient limited by pain Patient left: in chair;with call bell/phone within reach Nurse Communication: Patient requests pain meds PT Visit Diagnosis: Other abnormalities of gait and mobility (R26.89);Pain Pain - Right/Left: Right Pain - part of body: Knee     Time: [id number] PT Time Calculation (min) (ACUTE ONLY): 18 min  Charges:  $Gait Training: 8-22 mins          Kadia Abaya B. Kyre Jeffries, PT, DPT [phone]            12/04/2016, 1:01 PM

## 2016-12-05 DIAGNOSIS — R262 Difficulty in walking, not elsewhere classified: Secondary | ICD-10-CM | POA: Diagnosis not present

## 2016-12-05 DIAGNOSIS — M25561 Pain in right knee: Secondary | ICD-10-CM | POA: Diagnosis not present

## 2016-12-05 DIAGNOSIS — M1711 Unilateral primary osteoarthritis, right knee: Secondary | ICD-10-CM | POA: Diagnosis not present

## 2016-12-05 NOTE — Discharge Summary (Signed)
Patient ID: Christian Snyder MRN: [id number] DOB/AGE: 65/06/1950 65 y.o.  Admit date: 12/02/2016 Discharge date: 12/04/2016  Admission Diagnoses:  Principal Problem:   Primary localized osteoarthritis of right knee Active Problems:   Essential hypertension   Thyroid disease   Depression   Anxiety   Discharge Diagnoses:  Same  Past Medical History:  Diagnosis Date  . Anxiety   . Depression   . Early cataract   . Essential hypertension   . Primary localized osteoarthritis of right knee   . Primary localized osteoarthritis of right knee   . Right inguinal hernia   . Sleep apnea    [facility] dr. [name] wears CPAP at night  . Thyroid disease   . Wears glasses     Surgeries: Procedure(s): TOTAL KNEE ARTHROPLASTY on 12/02/2016   Consultants:   Discharged Condition: Improved  Hospital Course: Christian Snyder is an 66 y.o. male who was admitted 12/02/2016 for operative treatment ofPrimary localized osteoarthritis of right knee. Patient has severe unremitting pain that affects sleep, daily activities, and work/hobbies. After pre-op clearance the patient was taken to the operating room on 12/02/2016 and underwent  Procedure(s): TOTAL KNEE ARTHROPLASTY.    Patient was given perioperative antibiotics:  Anti-infectives (From admission, onward)   Start     Dose/Rate Route Frequency Ordered Stop   12/02/16 2300  vancomycin (VANCOCIN) IVPB 1000 mg/200 mL premix     1,000 mg 200 mL/hr over 60 Minutes Intravenous Every 12 hours 12/02/16 1526 12/02/16 2338   12/02/16 1301  cefUROXime (ZINACEF) injection  Status:  Discontinued       As needed 12/02/16 1302 12/02/16 1356   12/02/16 0600  vancomycin (VANCOCIN) 1,500 mg in sodium chloride 0.9 % 500 mL IVPB     1,500 mg 250 mL/hr over 120 Minutes Intravenous On call to O.R. 11/29/16 1337 12/02/16 1223       Patient was given sequential compression devices, early ambulation, and chemoprophylaxis to prevent DVT.  Patient benefited  maximally from hospital stay and there were no complications.    Recent vital signs: No data found.   Recent laboratory studies:  Recent Labs    12/03/16 0458 12/04/16 0457  WBC 14.3* 18.9*  HGB 9.8* 9.2*  HCT 30.2* 28.1*  PLT 307 260  NA 135 136  K 4.3 4.0  CL 102 105  CO2 26 26  BUN 11 14  CREATININE 1.09 1.11  GLUCOSE 143* 132*  CALCIUM 8.9 9.0     Discharge Medications:   Allergies as of 12/04/2016      Reactions   Celexa [citalopram]    Sleepiness, forgetfullness   Penicillins Hives   Has patient had a PCN reaction causing immediate rash, facial/tongue/throat swelling, SOB or lightheadedness with hypotension: Unknown Has patient had a PCN reaction causing severe rash involving mucus membranes or skin necrosis: Unknown Has patient had a PCN reaction that required hospitalization: Unknown Has patient had a PCN reaction occurring within the last 10 years: No If all of the above answers are "NO", then may proceed with Cephalosporin use.   Pravastatin    Fatigue, muscle aches      Medication List    STOP taking these medications   GLUCOSAMINE PO   Red Yeast Rice 600 MG Caps   traZODone 100 MG tablet Commonly known as:  DESYREL     TAKE these medications   acetaminophen 325 MG tablet Commonly known as:  TYLENOL Take 2 tablets (650 mg total) every 4 (four) hours as  needed by mouth for mild pain ((score 1 to 3) or temp > 100.5).   amLODipine 5 MG tablet Commonly known as:  NORVASC Take 10 mg by mouth daily.   aspirin 325 MG EC tablet 1 tab a day for the next 30 days to prevent blood clots   buPROPion 300 MG 24 hr tablet Commonly known as:  WELLBUTRIN XL Take 300 mg by mouth daily.   docusate sodium 100 MG capsule Commonly known as:  COLACE 1 tab 2 times a day while on narcotics.  STOOL SOFTENER   Fish Oil 1000 MG Caps Take 1,000 mg by mouth daily.   ibuprofen 200 MG tablet Commonly known as:  ADVIL,MOTRIN Take 200-400 mg by mouth every 8 (eight)  hours as needed for mild pain (depends on pain if takes 1-2 tablets).   lamoTRIgine 25 MG tablet Commonly known as:  LAMICTAL Take 50 mg by mouth daily.   levothyroxine 112 MCG tablet Commonly known as:  SYNTHROID, LEVOTHROID Take 112 mcg by mouth daily before breakfast.   lisinopril 40 MG tablet Commonly known as:  PRINIVIL,ZESTRIL Take 40 mg by mouth daily.   multivitamin with minerals Tabs tablet Take 1 tablet by mouth daily.   Oxycodone HCl 10 MG Tabs 1 po q 4 hrs prn pain   polyethylene glycol packet Commonly known as:  MIRALAX / GLYCOLAX 17grams in 6 oz of water twice a day until bowel movement.  LAXITIVE.  Restart if two days since last bowel movement            Discharge Care Instructions  (From admission, onward)        Start     Ordered   12/03/16 0000  Change dressing    Comments:  DO NOT REMOVE BANDAGE OVER SURGICAL INCISION.  WASH WHOLE LEG INCLUDING OVER THE WATERPROOF BANDAGE WITH SOAP AND WATER EVERY DAY.   12/03/16 1424      Diagnostic Studies: No results found.  Disposition: 01-Home or Self Care  Discharge Instructions    CPM   Complete by:  As directed    Continuous passive motion machine (CPM):      Use the CPM from 0 to 90 for 6 hours per day.       You may break it up into 2 or 3 sessions per day.      Use CPM for 2 weeks or until you are told to stop.   Call MD / Call 911   Complete by:  As directed    If you experience chest pain or shortness of breath, CALL 911 and be transported to the hospital emergency room.  If you develope a fever above 101 F, pus (white drainage) or increased drainage or redness at the wound, or calf pain, call your surgeon's office.   Change dressing   Complete by:  As directed    DO NOT REMOVE BANDAGE OVER SURGICAL INCISION.  WASH WHOLE LEG INCLUDING OVER THE WATERPROOF BANDAGE WITH SOAP AND WATER EVERY DAY.   Constipation Prevention   Complete by:  As directed    Drink plenty of fluids.  Prune juice may be  helpful.  You may use a stool softener, such as Colace (over the counter) 100 mg twice a day.  Use MiraLax (over the counter) for constipation as needed.   Diet - low sodium heart healthy   Complete by:  As directed    Discharge instructions   Complete by:  As directed    INSTRUCTIONS AFTER  JOINT REPLACEMENT   Remove items at home which could result in a fall. This includes throw rugs or furniture in walking pathways ICE to the affected joint every three hours while awake for 30 minutes at a time, for at least the first 3-5 days, and then as needed for pain and swelling.  Continue to use ice for pain and swelling. You may notice swelling that will progress down to the foot and ankle.  This is normal after surgery.  Elevate your leg when you are not up walking on it.   Continue to use the breathing machine you got in the hospital (incentive spirometer) which will help keep your temperature down.  It is common for your temperature to cycle up and down following surgery, especially at night when you are not up moving around and exerting yourself.  The breathing machine keeps your lungs expanded and your temperature down.   DIET:  As you were doing prior to hospitalization, we recommend a well-balanced diet.  DRESSING / WOUND CARE / SHOWERING  Keep the surgical dressing until follow up.  The dressing is water proof, so you can shower without any extra covering.  IF THE DRESSING FALLS OFF or the wound gets wet inside, change the dressing with sterile gauze.  Please use good hand washing techniques before changing the dressing.  Do not use any lotions or creams on the incision until instructed by your surgeon.    ACTIVITY  Increase activity slowly as tolerated, but follow the weight bearing instructions below.   No driving for 6 weeks or until further direction given by your physician.  You cannot drive while taking narcotics.  No lifting or carrying greater than 10 lbs. until further directed by  your surgeon. Avoid periods of inactivity such as sitting longer than an hour when not asleep. This helps prevent blood clots.  You may return to work once you are authorized by your doctor.     WEIGHT BEARING   Weight bearing as tolerated with assist device (walker, cane, etc) as directed, use it as long as suggested by your surgeon or therapist, typically at least 2-3 weeks.   EXERCISES  Results after joint replacement surgery are often greatly improved when you follow the exercise, range of motion and muscle strengthening exercises prescribed by your doctor. Safety measures are also important to protect the joint from further injury. Any time any of these exercises cause you to have increased pain or swelling, decrease what you are doing until you are comfortable again and then slowly increase them. If you have problems or questions, call your caregiver or physical therapist for advice.   Rehabilitation is important following a joint replacement. After just a few days of immobilization, the muscles of the leg can become weakened and shrink (atrophy).  These exercises are designed to build up the tone and strength of the thigh and leg muscles and to improve motion. Often times heat used for twenty to thirty minutes before working out will loosen up your tissues and help with improving the range of motion but do not use heat for the first two weeks following surgery (sometimes heat can increase post-operative swelling).   These exercises can be done on a training (exercise) mat, on the floor, on a table or on a bed. Use whatever works the best and is most comfortable for you.    Use music or television while you are exercising so that the exercises are a pleasant break in your day. This  will make your life better with the exercises acting as a break in your routine that you can look forward to.   Perform all exercises about fifteen times, three times per day or as directed.  You should exercise  both the operative leg and the other leg as well.   Exercises include:  Quad Sets - Tighten up the muscle on the front of the thigh (Quad) and hold for 5-10 seconds.   Straight Leg Raises - With your knee straight (if you were given a brace, keep it on), lift the leg to 60 degrees, hold for 3 seconds, and slowly lower the leg.  Perform this exercise against resistance later as your leg gets stronger.  Leg Slides: Lying on your back, slowly slide your foot toward your buttocks, bending your knee up off the floor (only go as far as is comfortable). Then slowly slide your foot back down until your leg is flat on the floor again.  Angel Wings: Lying on your back spread your legs to the side as far apart as you can without causing discomfort.  Hamstring Strength:  Lying on your back, push your heel against the floor with your leg straight by tightening up the muscles of your buttocks.  Repeat, but this time bend your knee to a comfortable angle, and push your heel against the floor.  You may put a pillow under the heel to make it more comfortable if necessary.   A rehabilitation program following joint replacement surgery can speed recovery and prevent re-injury in the future due to weakened muscles. Contact your doctor or a physical therapist for more information on knee rehabilitation.    CONSTIPATION  Constipation is defined medically as fewer than three stools per week and severe constipation as less than one stool per week.  Even if you have a regular bowel pattern at home, your normal regimen is likely to be disrupted due to multiple reasons following surgery.  Combination of anesthesia, postoperative narcotics, change in appetite and fluid intake all can affect your bowels.   YOU MUST use at least one of the following options; they are listed in order of increasing strength to get the job done.  They are all available over the counter, and you may need to use some, POSSIBLY even all of these  options:    Drink plenty of fluids (prune juice may be helpful) and high fiber foods Colace 100 mg by mouth twice a day  Senokot for constipation as directed and as needed Dulcolax (bisacodyl), take with full glass of water  Miralax (polyethylene glycol) once or twice a day as needed.  If you have tried all these things and are unable to have a bowel movement in the first 3-4 days after surgery call either your surgeon or your primary doctor.    If you experience loose stools or diarrhea, hold the medications until you stool forms back up.  If your symptoms do not get better within 1 week or if they get worse, check with your doctor.  If you experience "the worst abdominal pain ever" or develop nausea or vomiting, please contact the office immediately for further recommendations for treatment.   ITCHING:  If you experience itching with your medications, try taking only a single pain pill, or even half a pain pill at a time.  You can also use Benadryl over the counter for itching or also to help with sleep.   TED HOSE STOCKINGS:  Use stockings on both legs until  for at least 2 weeks or as directed by physician office. They may be removed at night for sleeping.  MEDICATIONS:  See your medication summary on the "After Visit Summary" that nursing will review with you.  You may have some home medications which will be placed on hold until you complete the course of blood thinner medication.  It is important for you to complete the blood thinner medication as prescribed.  PRECAUTIONS:  If you experience chest pain or shortness of breath - call 911 immediately for transfer to the hospital emergency department.   If you develop a fever greater that 101 F, purulent drainage from wound, increased redness or drainage from wound, foul odor from the wound/dressing, or calf pain - CONTACT YOUR SURGEON.                                                   FOLLOW-UP APPOINTMENTS:  If you do not already have a  post-op appointment, please call the office for an appointment to be seen by your surgeon.  Guidelines for how soon to be seen are listed in your "After Visit Summary", but are typically between 1-4 weeks after surgery.  OTHER INSTRUCTIONS:   Knee Replacement:  Do not place pillow under knee, focus on keeping the knee straight while resting. CPM instructions: 0-90 degrees, 2 hours in the morning, 2 hours in the afternoon, and 2 hours in the evening. Place foam block, curve side up under heel at all times except when in CPM or when walking.  DO NOT modify, tear, cut, or change the foam block in any way.  MAKE SURE YOU:  Understand these instructions.  Get help right away if you are not doing well or get worse.    Thank you for letting us be a part of your medical care team.  It is a privilege we respect greatly.  We hope these instructions will help you stay on track for a fast and full recovery!   Do not put a pillow under the knee. Place it under the heel.   Complete by:  As directed    Place gray foam block, curve side up under heel at all times except when in CPM or when walking.  DO NOT modify, tear, cut, or change in any way the gray foam block.   Increase activity slowly as tolerated   Complete by:  As directed    Patient may shower   Complete by:  As directed    Aquacel dressing is water proof    Wash over it and the whole leg with soap and water at the end of your shower   TED hose   Complete by:  As directed    Use stockings (TED hose) for 2 weeks on both leg(s).  You may remove them at night for sleeping.      Follow-up Information    [name], MD Follow up on 12/16/2016.   Specialty:  Orthopedic Surgery Why:  appt time 3:30 pm Contact information: [address] [place] [address] [phone]        [facility], [facility] Follow up on 12/05/2016.   Specialty:  Orthopedic Surgery Why:  Physical Therapy appointment at Dr Thurston Hole  office arrive at 1:45 for 2pm appointment Contact information: [address] [place] [address] [phone]  SignedPascal Lux 12/05/2016, 9:35 AM

## 2016-12-09 DIAGNOSIS — R262 Difficulty in walking, not elsewhere classified: Secondary | ICD-10-CM | POA: Diagnosis not present

## 2016-12-09 DIAGNOSIS — M25561 Pain in right knee: Secondary | ICD-10-CM | POA: Diagnosis not present

## 2016-12-09 DIAGNOSIS — M1711 Unilateral primary osteoarthritis, right knee: Secondary | ICD-10-CM | POA: Diagnosis not present

## 2016-12-11 DIAGNOSIS — R262 Difficulty in walking, not elsewhere classified: Secondary | ICD-10-CM | POA: Diagnosis not present

## 2016-12-11 DIAGNOSIS — M25561 Pain in right knee: Secondary | ICD-10-CM | POA: Diagnosis not present

## 2016-12-11 DIAGNOSIS — M1711 Unilateral primary osteoarthritis, right knee: Secondary | ICD-10-CM | POA: Diagnosis not present

## 2016-12-16 DIAGNOSIS — M25561 Pain in right knee: Secondary | ICD-10-CM | POA: Diagnosis not present

## 2016-12-16 DIAGNOSIS — M1711 Unilateral primary osteoarthritis, right knee: Secondary | ICD-10-CM | POA: Diagnosis not present

## 2016-12-16 DIAGNOSIS — R262 Difficulty in walking, not elsewhere classified: Secondary | ICD-10-CM | POA: Diagnosis not present

## 2016-12-18 DIAGNOSIS — M1711 Unilateral primary osteoarthritis, right knee: Secondary | ICD-10-CM | POA: Diagnosis not present

## 2016-12-18 DIAGNOSIS — R262 Difficulty in walking, not elsewhere classified: Secondary | ICD-10-CM | POA: Diagnosis not present

## 2016-12-18 DIAGNOSIS — M25561 Pain in right knee: Secondary | ICD-10-CM | POA: Diagnosis not present

## 2016-12-20 DIAGNOSIS — M25561 Pain in right knee: Secondary | ICD-10-CM | POA: Diagnosis not present

## 2016-12-20 DIAGNOSIS — R262 Difficulty in walking, not elsewhere classified: Secondary | ICD-10-CM | POA: Diagnosis not present

## 2016-12-20 DIAGNOSIS — M1711 Unilateral primary osteoarthritis, right knee: Secondary | ICD-10-CM | POA: Diagnosis not present

## 2016-12-23 DIAGNOSIS — M25561 Pain in right knee: Secondary | ICD-10-CM | POA: Diagnosis not present

## 2016-12-23 DIAGNOSIS — M1711 Unilateral primary osteoarthritis, right knee: Secondary | ICD-10-CM | POA: Diagnosis not present

## 2016-12-23 DIAGNOSIS — R262 Difficulty in walking, not elsewhere classified: Secondary | ICD-10-CM | POA: Diagnosis not present

## 2016-12-25 DIAGNOSIS — M25561 Pain in right knee: Secondary | ICD-10-CM | POA: Diagnosis not present

## 2016-12-25 DIAGNOSIS — M1711 Unilateral primary osteoarthritis, right knee: Secondary | ICD-10-CM | POA: Diagnosis not present

## 2016-12-25 DIAGNOSIS — R262 Difficulty in walking, not elsewhere classified: Secondary | ICD-10-CM | POA: Diagnosis not present

## 2016-12-27 DIAGNOSIS — R262 Difficulty in walking, not elsewhere classified: Secondary | ICD-10-CM | POA: Diagnosis not present

## 2016-12-27 DIAGNOSIS — M1711 Unilateral primary osteoarthritis, right knee: Secondary | ICD-10-CM | POA: Diagnosis not present

## 2016-12-27 DIAGNOSIS — M25561 Pain in right knee: Secondary | ICD-10-CM | POA: Diagnosis not present

## 2017-01-01 DIAGNOSIS — M1711 Unilateral primary osteoarthritis, right knee: Secondary | ICD-10-CM | POA: Diagnosis not present

## 2017-01-01 DIAGNOSIS — M25561 Pain in right knee: Secondary | ICD-10-CM | POA: Diagnosis not present

## 2017-01-01 DIAGNOSIS — R262 Difficulty in walking, not elsewhere classified: Secondary | ICD-10-CM | POA: Diagnosis not present

## 2017-01-03 DIAGNOSIS — R262 Difficulty in walking, not elsewhere classified: Secondary | ICD-10-CM | POA: Diagnosis not present

## 2017-01-03 DIAGNOSIS — M1711 Unilateral primary osteoarthritis, right knee: Secondary | ICD-10-CM | POA: Diagnosis not present

## 2017-01-03 DIAGNOSIS — M25561 Pain in right knee: Secondary | ICD-10-CM | POA: Diagnosis not present

## 2017-01-09 DIAGNOSIS — M1711 Unilateral primary osteoarthritis, right knee: Secondary | ICD-10-CM | POA: Diagnosis not present

## 2017-01-27 DIAGNOSIS — R262 Difficulty in walking, not elsewhere classified: Secondary | ICD-10-CM | POA: Diagnosis not present

## 2017-01-27 DIAGNOSIS — M25561 Pain in right knee: Secondary | ICD-10-CM | POA: Diagnosis not present

## 2017-01-27 DIAGNOSIS — M1711 Unilateral primary osteoarthritis, right knee: Secondary | ICD-10-CM | POA: Diagnosis not present

## 2017-01-29 DIAGNOSIS — M79604 Pain in right leg: Secondary | ICD-10-CM | POA: Diagnosis not present

## 2017-02-04 DIAGNOSIS — M1711 Unilateral primary osteoarthritis, right knee: Secondary | ICD-10-CM | POA: Diagnosis not present

## 2017-03-11 DIAGNOSIS — M1711 Unilateral primary osteoarthritis, right knee: Secondary | ICD-10-CM | POA: Diagnosis not present

## 2017-03-24 DIAGNOSIS — G4733 Obstructive sleep apnea (adult) (pediatric): Secondary | ICD-10-CM | POA: Diagnosis not present

## 2017-06-10 DIAGNOSIS — M1711 Unilateral primary osteoarthritis, right knee: Secondary | ICD-10-CM | POA: Diagnosis not present

## 2017-07-02 DIAGNOSIS — K4090 Unilateral inguinal hernia, without obstruction or gangrene, not specified as recurrent: Secondary | ICD-10-CM | POA: Diagnosis not present

## 2017-07-02 DIAGNOSIS — L309 Dermatitis, unspecified: Secondary | ICD-10-CM | POA: Diagnosis not present

## 2017-07-21 ENCOUNTER — Ambulatory Visit: Payer: Self-pay | Admitting: Surgery

## 2017-07-21 DIAGNOSIS — K4020 Bilateral inguinal hernia, without obstruction or gangrene, not specified as recurrent: Secondary | ICD-10-CM | POA: Diagnosis not present

## 2017-07-21 NOTE — H&P (Signed)
Christian Snyder Documented: 07/21/2017 10:47 AM Location: [facility] Patient #: [id number] DOB: 19-Nov-1950 Married / Language: [place] / Race: White Male  History of Present Illness Christian Snyder Fus(Christian Snyder A. Christian Salyers MD; 07/21/2017 11:41 AM) Patient words: The patient presents with chief complaint of bilateral groin swelling. He was seen and this almost 10 years ago with a very small right inguinal hernia. He opted for observation. He's done fairly well throughout the years with minimal pain but the swelling in his right groin is Larger and he is now developed swelling in his left groin. He has mild discomfort in both groins but no severe pain especially with exertion. The swelling has gotten worse he thinks though. He denies any nausea, vomiting, difficulty with bowel or bladder function. He denies any burning or itching. The area is smaller when he is supine.  The patient is a 67 year old male.   Past Surgical History (Christian A. Christian PasseyBrown, Christian Snyder; 07/21/2017 10:47 AM) Knee Surgery Right. Tonsillectomy  Diagnostic Studies History (Christian A. Christian PasseyBrown, Christian Snyder; 07/21/2017 10:47 AM) Colonoscopy 1-5 years ago  Allergies (Christian A. Christian PasseyBrown, Christian Snyder; 07/21/2017 10:49 AM) Penicillins Allergies Reconciled  Medication History (Christian A. Christian PasseyBrown, Christian Snyder; 07/21/2017 10:50 AM) AmLODIPine Besylate (5MG  Tablet, Oral) Active. BuPROPion HCl ER (XL) (300MG  Tablet ER 24HR, Oral) Active. Fish Oil (1000MG  Capsule, Oral) Active. LamoTRIgine (25MG  Tablet, Oral) Active. Levothyroxine Sodium (112MCG Tablet, Oral) Active. Lisinopril (40MG  Tablet, Oral) Active. Multi-Vitamin (Oral) Active. Red Yeast Rice (Oral) Specific strength unknown - Active. Medications Reconciled  Social History (Christian A. Christian PasseyBrown, Christian Snyder; 07/21/2017 10:47 AM) Alcohol use Occasional alcohol use. Caffeine use Coffee. No drug use Tobacco use Former smoker.  Family History (Christian A. Christian PasseyBrown, Christian Snyder; 07/21/2017 10:47 AM) Cancer Mother. Heart Disease  Father. Respiratory Condition Father.  Other Problems (Christian A. Christian PasseyBrown, Christian Snyder; 07/21/2017 10:47 AM) Depression High blood pressure Inguinal Hernia Sleep Apnea     Review of Systems (Christian Snyder; 07/21/2017 10:47 AM) General Not Present- Appetite Loss, Chills, Fatigue, Fever, Night Sweats, Weight Gain and Weight Loss. Skin Not Present- Change in Wart/Mole, Dryness, Hives, Jaundice, New Lesions, Non-Healing Wounds, Rash and Ulcer. HEENT Present- Seasonal Allergies and Wears glasses/contact lenses. Not Present- Earache, Hearing Loss, Hoarseness, Nose Bleed, Oral Ulcers, Ringing in the Ears, Sinus Pain, Sore Throat, Visual Disturbances and Yellow Eyes. Respiratory Not Present- Bloody sputum, Chronic Cough, Difficulty Breathing, Snoring and Wheezing. Breast Not Present- Breast Mass, Breast Pain, Nipple Discharge and Skin Changes. Cardiovascular Not Present- Chest Pain, Difficulty Breathing Lying Down, Leg Cramps, Palpitations, Rapid Heart Rate, Shortness of Breath and Swelling of Extremities. Gastrointestinal Not Present- Abdominal Pain, Bloating, Bloody Stool, Change in Bowel Habits, Chronic diarrhea, Constipation, Difficulty Swallowing, Excessive gas, Gets full quickly at meals, Hemorrhoids, Indigestion, Nausea, Rectal Pain and Vomiting. Male Genitourinary Not Present- Blood in Urine, Change in Urinary Stream, Frequency, Impotence, Nocturia, Painful Urination, Urgency and Urine Leakage. Musculoskeletal Not Present- Back Pain, Joint Pain, Joint Stiffness, Muscle Pain, Muscle Weakness and Swelling of Extremities. Neurological Not Present- Decreased Memory, Fainting, Headaches, Numbness, Seizures, Tingling, Tremor, Trouble walking and Weakness. Psychiatric Present- Depression. Not Present- Anxiety, Bipolar, Change in Sleep Pattern, Fearful and Frequent crying. Endocrine Not Present- Cold Intolerance, Excessive Hunger, Hair Changes, Heat Intolerance, Hot flashes and New  Diabetes. Hematology Not Present- Blood Thinners, Easy Bruising, Excessive bleeding, Gland problems, HIV and Persistent Infections.  Vitals (Christian Snyder; 07/21/2017 10:49 AM) 07/21/2017 10:48 AM Weight: 229 lb Height: 76in Body Surface Area: 2.35 m Body Mass Index: 27.87 kg/m  Temp.: 97.78F  Pulse: 92 (Regular)  BP: 128/74 (Sitting, Left Arm, Standard)      Physical Exam (Christian Snyder A. Zaniel Marineau MD; 07/21/2017 11:41 AM)  General Mental Status-Alert. General Appearance-Consistent with stated age. Hydration-Well hydrated. Voice-Normal.  Head and Neck Head-normocephalic, atraumatic with no lesions or palpable masses. Trachea-midline. Thyroid Gland Characteristics - normal size and consistency.  Eye Eyeball - Bilateral-Extraocular movements intact. Sclera/Conjunctiva - Bilateral-No scleral icterus.  Chest and Lung Exam Chest and lung exam reveals -quiet, even and easy respiratory effort with no use of accessory muscles and on auscultation, normal breath sounds, no adventitious sounds and normal vocal resonance. Inspection Chest Wall - Normal. Back - normal.  Cardiovascular Cardiovascular examination reveals -normal heart sounds, regular rate and rhythm with no murmurs and normal pedal pulses bilaterally.  Abdomen Note: Bilateral reducible inguinal hernia right greater than left.  Otherwise soft nontender without rebound or guarding.  Neurologic Neurologic evaluation reveals -alert and oriented x 3 with no impairment of recent or remote memory. Mental Status-Normal.  Musculoskeletal Normal Exam - Left-Upper Extremity Strength Normal and Lower Extremity Strength Normal. Normal Exam - Right-Upper Extremity Strength Normal and Lower Extremity Strength Normal.    Assessment & Plan (Christian Christian Snyder A. Christian Rosier MD; 07/21/2017 11:42 AM)  BILATERAL INGUINAL HERNIA WITHOUT OBSTRUCTION OR GANGRENE, RECURRENCE NOT SPECIFIED (K40.20) Impression:  Discussed laparoscopic and open procedures. Pros and cons as well as long-term expectation recurrence rates of each discussed. Discussed the use of mesh and the pros and cons of mesh used. He has opted for open bilateral inguinal hernia repair with mesh. The risk of hernia repair include bleeding, infection, organ injury, bowel injury, bladder injury, nerve injury recurrent hernia, blood clots, worsening of underlying condition, chronic pain, mesh use, open surgery, death, and the need for other operattions. Pt agrees to proceed  Current Plans You are being scheduled for surgery- Our schedulers will call you.  You should hear from our office's scheduling department within 5 working days about the location, date, and time of surgery. We try to make accommodations for patient's preferences in scheduling surgery, but sometimes the OR schedule or the surgeon's schedule prevents [place] from making those accommodations.  If you have not heard from our office [phone] in 5 working days, call the office and ask for your surgeon's nurse.  If you have other questions about your diagnosis, plan, or surgery, call the office and ask for your surgeon's nurse.  Pt Education - Pamphlet Given - Hernia Surgery: discussed with patient and provided information. The anatomy & physiology of the abdominal wall and pelvic floor was discussed. The pathophysiology of hernias in the inguinal and pelvic region was discussed. Natural history risks such as progressive enlargement, pain, incarceration, and strangulation was discussed. Contributors to complications such as smoking, obesity, diabetes, prior surgery, etc were discussed.  I feel the risks of no intervention will lead to serious problems that outweigh the operative risks; therefore, I recommended surgery to reduce and repair the hernia. I explained laparoscopic techniques with possible need for an open approach. I noted usual use of mesh to patch and/or  buttress hernia repair  Risks such as bleeding, infection, abscess, need for further treatment, heart attack, death, and other risks were discussed. I noted a good likelihood this will help address the problem. Goals of post-operative recovery were discussed as well. Possibility that this will not correct all symptoms was explained. I stressed the importance of low-impact activity, aggressive pain control, avoiding constipation, & not pushing through pain to minimize  risk of post-operative chronic pain or injury. Possibility of reherniation was discussed. We will work to minimize complications.  An educational handout further explaining the pathology & treatment options was given as well. Questions were answered. The patient expresses understanding & wishes to proceed with surgery.  Pt Education - CCS Mesh education: discussed with patient and provided information.

## 2017-07-21 NOTE — H&P (View-Only) (Signed)
Christian Snyder Documented: 07/21/2017 10:47 AM Location: [facility] Patient #: [id number] DOB: 19-Nov-1950 Married / Language: [place] / Race: White Male  History of Present Illness Christian Snyder(Christian Haji A. Chrisy Hillebrand MD; 07/21/2017 11:41 AM) Patient words: The patient presents with chief complaint of bilateral groin swelling. He was seen and this almost 10 years ago with a very small right inguinal hernia. He opted for observation. He's done fairly well throughout the years with minimal pain but the swelling in his right groin is Larger and he is now developed swelling in his left groin. He has mild discomfort in both groins but no severe pain especially with exertion. The swelling has gotten worse he thinks though. He denies any nausea, vomiting, difficulty with bowel or bladder function. He denies any burning or itching. The area is smaller when he is supine.  The patient is a 67 year old male.   Past Surgical History (Christian A. Manson PasseyBrown, Snyder; 07/21/2017 10:47 AM) Knee Surgery Right. Tonsillectomy  Diagnostic Studies History (Christian A. Manson PasseyBrown, Snyder; 07/21/2017 10:47 AM) Colonoscopy 1-5 years ago  Allergies (Christian A. Manson PasseyBrown, Snyder; 07/21/2017 10:49 AM) Christian Snyder Allergies Reconciled  Medication History (Christian A. Manson PasseyBrown, Snyder; 07/21/2017 10:50 AM) AmLODIPine Besylate (5MG  Tablet, Oral) Active. BuPROPion HCl ER (XL) (300MG  Tablet ER 24HR, Oral) Active. Fish Oil (1000MG  Capsule, Oral) Active. LamoTRIgine (25MG  Tablet, Oral) Active. Levothyroxine Sodium (112MCG Tablet, Oral) Active. Lisinopril (40MG  Tablet, Oral) Active. Multi-Vitamin (Oral) Active. Red Yeast Rice (Oral) Specific strength unknown - Active. Medications Reconciled  Social History (Christian A. Manson PasseyBrown, Snyder; 07/21/2017 10:47 AM) Alcohol use Occasional alcohol use. Caffeine use Coffee. No drug use Tobacco use Former smoker.  Family History (Christian A. Manson PasseyBrown, Snyder; 07/21/2017 10:47 AM) Cancer Mother. Heart Disease  Father. Respiratory Condition Father.  Other Problems (Christian A. Manson PasseyBrown, Snyder; 07/21/2017 10:47 AM) Depression High blood pressure Inguinal Hernia Sleep Apnea     Review of Systems (Christian Snyder; 07/21/2017 10:47 AM) General Not Present- Appetite Loss, Chills, Fatigue, Fever, Night Sweats, Weight Gain and Weight Loss. Skin Not Present- Change in Wart/Mole, Dryness, Hives, Jaundice, New Lesions, Non-Healing Wounds, Rash and Ulcer. HEENT Present- Seasonal Allergies and Wears glasses/contact lenses. Not Present- Earache, Hearing Loss, Hoarseness, Nose Bleed, Oral Ulcers, Ringing in the Ears, Sinus Pain, Sore Throat, Visual Disturbances and Yellow Eyes. Respiratory Not Present- Bloody sputum, Chronic Cough, Difficulty Breathing, Snoring and Wheezing. Breast Not Present- Breast Mass, Breast Pain, Nipple Discharge and Skin Changes. Cardiovascular Not Present- Chest Pain, Difficulty Breathing Lying Down, Leg Cramps, Palpitations, Rapid Heart Rate, Shortness of Breath and Swelling of Extremities. Gastrointestinal Not Present- Abdominal Pain, Bloating, Bloody Stool, Change in Bowel Habits, Chronic diarrhea, Constipation, Difficulty Swallowing, Excessive gas, Gets full quickly at meals, Hemorrhoids, Indigestion, Nausea, Rectal Pain and Vomiting. Male Genitourinary Not Present- Blood in Urine, Change in Urinary Stream, Frequency, Impotence, Nocturia, Painful Urination, Urgency and Urine Leakage. Musculoskeletal Not Present- Back Pain, Joint Pain, Joint Stiffness, Muscle Pain, Muscle Weakness and Swelling of Extremities. Neurological Not Present- Decreased Memory, Fainting, Headaches, Numbness, Seizures, Tingling, Tremor, Trouble walking and Weakness. Psychiatric Present- Depression. Not Present- Anxiety, Bipolar, Change in Sleep Pattern, Fearful and Frequent crying. Endocrine Not Present- Cold Intolerance, Excessive Hunger, Hair Changes, Heat Intolerance, Hot flashes and New  Diabetes. Hematology Not Present- Blood Thinners, Easy Bruising, Excessive bleeding, Gland problems, HIV and Persistent Infections.  Vitals (Christian Snyder; 07/21/2017 10:49 AM) 07/21/2017 10:48 AM Weight: 229 lb Height: 76in Body Surface Area: 2.35 m Body Mass Index: 27.87 kg/m  Temp.: 97.29F  Pulse: 92 (Regular)  BP: 128/74 (Sitting, Left Arm, Standard)      Physical Exam (Christian Kasper A. Triton Heidrich MD; 07/21/2017 11:41 AM)  General Mental Status-Alert. General Appearance-Consistent with stated age. Hydration-Well hydrated. Voice-Normal.  Head and Neck Head-normocephalic, atraumatic with no lesions or palpable masses. Trachea-midline. Thyroid Gland Characteristics - normal size and consistency.  Eye Eyeball - Bilateral-Extraocular movements intact. Sclera/Conjunctiva - Bilateral-No scleral icterus.  Chest and Lung Exam Chest and lung exam reveals -quiet, even and easy respiratory effort with no use of accessory muscles and on auscultation, normal breath sounds, no adventitious sounds and normal vocal resonance. Inspection Chest Wall - Normal. Back - normal.  Cardiovascular Cardiovascular examination reveals -normal heart sounds, regular rate and rhythm with no murmurs and normal pedal pulses bilaterally.  Abdomen Note: Bilateral reducible inguinal hernia right greater than left.  Otherwise soft nontender without rebound or guarding.  Neurologic Neurologic evaluation reveals -alert and oriented x 3 with no impairment of recent or remote memory. Mental Status-Normal.  Musculoskeletal Normal Exam - Left-Upper Extremity Strength Normal and Lower Extremity Strength Normal. Normal Exam - Right-Upper Extremity Strength Normal and Lower Extremity Strength Normal.    Assessment & Plan (Christian Vasco A. Travus Oren MD; 07/21/2017 11:42 AM)  BILATERAL INGUINAL HERNIA WITHOUT OBSTRUCTION OR GANGRENE, RECURRENCE NOT SPECIFIED (K40.20) Impression:  Discussed laparoscopic and open procedures. Pros and cons as well as long-term expectation recurrence rates of each discussed. Discussed the use of mesh and the pros and cons of mesh used. He has opted for open bilateral inguinal hernia repair with mesh. The risk of hernia repair include bleeding, infection, organ injury, bowel injury, bladder injury, nerve injury recurrent hernia, blood clots, worsening of underlying condition, chronic pain, mesh use, open surgery, death, and the need for other operattions. Pt agrees to proceed  Current Plans You are being scheduled for surgery- Our schedulers will call you.  You should hear from our office's scheduling department within 5 working days about the location, date, and time of surgery. We try to make accommodations for patient's preferences in scheduling surgery, but sometimes the OR schedule or the surgeon's schedule prevents [place] from making those accommodations.  If you have not heard from our office [phone]) in 5 working days, call the office and ask for your surgeon's nurse.  If you have other questions about your diagnosis, plan, or surgery, call the office and ask for your surgeon's nurse.  Pt Education - Pamphlet Given - Hernia Surgery: discussed with patient and provided information. The anatomy & physiology of the abdominal wall and pelvic floor was discussed. The pathophysiology of hernias in the inguinal and pelvic region was discussed. Natural history risks such as progressive enlargement, pain, incarceration, and strangulation was discussed. Contributors to complications such as smoking, obesity, diabetes, prior surgery, etc were discussed.  I feel the risks of no intervention will lead to serious problems that outweigh the operative risks; therefore, I recommended surgery to reduce and repair the hernia. I explained laparoscopic techniques with possible need for an open approach. I noted usual use of mesh to patch and/or  buttress hernia repair  Risks such as bleeding, infection, abscess, need for further treatment, heart attack, death, and other risks were discussed. I noted a good likelihood this will help address the problem. Goals of post-operative recovery were discussed as well. Possibility that this will not correct all symptoms was explained. I stressed the importance of low-impact activity, aggressive pain control, avoiding constipation, & not pushing through pain to minimize  risk of post-operative chronic pain or injury. Possibility of reherniation was discussed. We will work to minimize complications.  An educational handout further explaining the pathology & treatment options was given as well. Questions were answered. The patient expresses understanding & wishes to proceed with surgery.  Pt Education - CCS Mesh education: discussed with patient and provided information.

## 2017-07-28 ENCOUNTER — Encounter (HOSPITAL_BASED_OUTPATIENT_CLINIC_OR_DEPARTMENT_OTHER): Payer: Self-pay | Admitting: *Deleted

## 2017-07-28 ENCOUNTER — Other Ambulatory Visit: Payer: Self-pay

## 2017-07-30 ENCOUNTER — Encounter (HOSPITAL_BASED_OUTPATIENT_CLINIC_OR_DEPARTMENT_OTHER)
Admission: RE | Admit: 2017-07-30 | Discharge: 2017-07-30 | Disposition: A | Discharge status: Home / Self Care | Payer: Medicare Other | Source: Ambulatory Visit | Attending: Surgery | Admitting: Surgery

## 2017-07-30 ENCOUNTER — Encounter (HOSPITAL_BASED_OUTPATIENT_CLINIC_OR_DEPARTMENT_OTHER): Payer: Self-pay | Admitting: *Deleted

## 2017-07-30 DIAGNOSIS — I10 Essential (primary) hypertension: Secondary | ICD-10-CM | POA: Diagnosis not present

## 2017-07-30 DIAGNOSIS — F329 Major depressive disorder, single episode, unspecified: Secondary | ICD-10-CM | POA: Diagnosis not present

## 2017-07-30 DIAGNOSIS — Z01818 Encounter for other preprocedural examination: Secondary | ICD-10-CM | POA: Insufficient documentation

## 2017-07-30 DIAGNOSIS — Z79890 Hormone replacement therapy (postmenopausal): Secondary | ICD-10-CM | POA: Diagnosis not present

## 2017-07-30 DIAGNOSIS — G4730 Sleep apnea, unspecified: Secondary | ICD-10-CM | POA: Diagnosis not present

## 2017-07-30 DIAGNOSIS — K4020 Bilateral inguinal hernia, without obstruction or gangrene, not specified as recurrent: Secondary | ICD-10-CM | POA: Diagnosis not present

## 2017-07-30 DIAGNOSIS — K4090 Unilateral inguinal hernia, without obstruction or gangrene, not specified as recurrent: Secondary | ICD-10-CM | POA: Diagnosis present

## 2017-07-30 DIAGNOSIS — Z87891 Personal history of nicotine dependence: Secondary | ICD-10-CM | POA: Diagnosis not present

## 2017-07-30 DIAGNOSIS — Z79899 Other long term (current) drug therapy: Secondary | ICD-10-CM | POA: Diagnosis not present

## 2017-07-30 LAB — COMPREHENSIVE METABOLIC PANEL
ALK PHOS: 88 U/L (ref 38–126)
ALT: 303 U/L — ABNORMAL HIGH (ref 0–44)
AST: 100 U/L — AB (ref 15–41)
Albumin: 3.9 g/dL (ref 3.5–5.0)
Anion gap: 7 (ref 5–15)
BUN: 30 mg/dL — AB (ref 8–23)
CALCIUM: 9.4 mg/dL (ref 8.9–10.3)
CHLORIDE: 105 mmol/L (ref 98–111)
CO2: 26 mmol/L (ref 22–32)
CREATININE: 1.57 mg/dL — AB (ref 0.61–1.24)
GFR calc Af Amer: 51 mL/min — ABNORMAL LOW (ref >60)
GFR calc non Af Amer: 44 mL/min — ABNORMAL LOW (ref >60)
GLUCOSE: 108 mg/dL — AB (ref 70–99)
Potassium: 4.4 mmol/L (ref 3.5–5.1)
SODIUM: 138 mmol/L (ref 135–145)
Total Bilirubin: 0.4 mg/dL (ref 0.3–1.2)
Total Protein: 6.9 g/dL (ref 6.5–8.1)

## 2017-07-30 LAB — CBC WITH DIFFERENTIAL/PLATELET
Abs Immature Granulocytes: 0 10*3/uL (ref 0.0–0.1)
BASOS ABS: 0.1 10*3/uL (ref 0.0–0.1)
BASOS PCT: 1 %
Eosinophils Absolute: 0.4 10*3/uL (ref 0.0–0.7)
Eosinophils Relative: 3 %
HCT: 38.2 % — ABNORMAL LOW (ref 39.0–52.0)
HEMOGLOBIN: 11.9 g/dL — AB (ref 13.0–17.0)
IMMATURE GRANULOCYTES: 0 %
LYMPHS PCT: 40 %
Lymphs Abs: 5 10*3/uL — ABNORMAL HIGH (ref 0.7–4.0)
MCH: 30.2 pg (ref 26.0–34.0)
MCHC: 31.2 g/dL (ref 30.0–36.0)
MCV: 97 fL (ref 78.0–100.0)
MONO ABS: 1.2 10*3/uL — AB (ref 0.1–1.0)
MONOS PCT: 9 %
NEUTROS PCT: 47 %
Neutro Abs: 5.8 10*3/uL (ref 1.7–7.7)
PLATELETS: 255 10*3/uL (ref 150–400)
RBC: 3.94 MIL/uL — ABNORMAL LOW (ref 4.22–5.81)
RDW: 14.8 % (ref 11.5–15.5)
WBC: 12.5 10*3/uL — ABNORMAL HIGH (ref 4.0–10.5)

## 2017-07-30 NOTE — Progress Notes (Addendum)
Ensure pre surgery drink given with instructions to complete by 0600 dos, surgical soap given with instructions, pt verbalized understanding.  Lab results reviewed by Dr. Desmond Lopeurk, notified Steward DroneBrenda at Dr Cornett's office about pt's lab results.

## 2017-08-05 ENCOUNTER — Encounter (HOSPITAL_BASED_OUTPATIENT_CLINIC_OR_DEPARTMENT_OTHER)
Admission: RE | Disposition: A | Discharge status: Home / Self Care | Payer: Self-pay | Source: Ambulatory Visit | Attending: Surgery

## 2017-08-05 ENCOUNTER — Other Ambulatory Visit: Payer: Self-pay

## 2017-08-05 ENCOUNTER — Encounter (HOSPITAL_BASED_OUTPATIENT_CLINIC_OR_DEPARTMENT_OTHER): Payer: Self-pay

## 2017-08-05 ENCOUNTER — Ambulatory Visit (HOSPITAL_BASED_OUTPATIENT_CLINIC_OR_DEPARTMENT_OTHER): Payer: Medicare Other | Admitting: Anesthesiology

## 2017-08-05 ENCOUNTER — Ambulatory Visit (HOSPITAL_BASED_OUTPATIENT_CLINIC_OR_DEPARTMENT_OTHER)
Admission: RE | Admit: 2017-08-05 | Discharge: 2017-08-05 | Disposition: A | Discharge status: Home / Self Care | Payer: Medicare Other | Source: Ambulatory Visit | Attending: Surgery | Admitting: Surgery

## 2017-08-05 DIAGNOSIS — Z79890 Hormone replacement therapy (postmenopausal): Secondary | ICD-10-CM | POA: Insufficient documentation

## 2017-08-05 DIAGNOSIS — G4730 Sleep apnea, unspecified: Secondary | ICD-10-CM | POA: Insufficient documentation

## 2017-08-05 DIAGNOSIS — I10 Essential (primary) hypertension: Secondary | ICD-10-CM | POA: Insufficient documentation

## 2017-08-05 DIAGNOSIS — K4090 Unilateral inguinal hernia, without obstruction or gangrene, not specified as recurrent: Secondary | ICD-10-CM | POA: Diagnosis not present

## 2017-08-05 DIAGNOSIS — G8918 Other acute postprocedural pain: Secondary | ICD-10-CM | POA: Diagnosis not present

## 2017-08-05 DIAGNOSIS — F329 Major depressive disorder, single episode, unspecified: Secondary | ICD-10-CM | POA: Diagnosis not present

## 2017-08-05 DIAGNOSIS — Z87891 Personal history of nicotine dependence: Secondary | ICD-10-CM | POA: Diagnosis not present

## 2017-08-05 DIAGNOSIS — Z79899 Other long term (current) drug therapy: Secondary | ICD-10-CM | POA: Diagnosis not present

## 2017-08-05 DIAGNOSIS — K4020 Bilateral inguinal hernia, without obstruction or gangrene, not specified as recurrent: Secondary | ICD-10-CM | POA: Insufficient documentation

## 2017-08-05 DIAGNOSIS — F418 Other specified anxiety disorders: Secondary | ICD-10-CM | POA: Diagnosis not present

## 2017-08-05 HISTORY — PX: INSERTION OF MESH: SHX5868

## 2017-08-05 HISTORY — PX: INGUINAL HERNIA REPAIR: SHX194

## 2017-08-05 HISTORY — DX: Hypothyroidism, unspecified: E03.9

## 2017-08-05 SURGERY — REPAIR, HERNIA, INGUINAL, BILATERAL, ADULT
Anesthesia: General | Site: Groin | Laterality: Bilateral

## 2017-08-05 MED ORDER — CELECOXIB 200 MG PO CAPS
ORAL_CAPSULE | ORAL | Status: AC
  Filled 2017-08-05: qty 2

## 2017-08-05 MED ORDER — LIDOCAINE HCL (CARDIAC) PF 100 MG/5ML IV SOSY
PREFILLED_SYRINGE | INTRAVENOUS | Status: DC | PRN
  Administered 2017-08-05: 100 mg via INTRAVENOUS

## 2017-08-05 MED ORDER — ROCURONIUM BROMIDE 100 MG/10ML IV SOLN
INTRAVENOUS | Status: DC | PRN
  Administered 2017-08-05: 50 mg via INTRAVENOUS
  Administered 2017-08-05: 20 mg via INTRAVENOUS

## 2017-08-05 MED ORDER — GABAPENTIN 300 MG PO CAPS
300.0000 mg | ORAL_CAPSULE | ORAL | Status: AC
  Administered 2017-08-05: 300 mg via ORAL

## 2017-08-05 MED ORDER — MEPERIDINE HCL 25 MG/ML IJ SOLN: 6.2500 mg | INTRAMUSCULAR | Status: DC | PRN

## 2017-08-05 MED ORDER — SCOPOLAMINE 1 MG/3DAYS TD PT72: 1.0000 | MEDICATED_PATCH | Freq: Once | TRANSDERMAL | Status: DC | PRN

## 2017-08-05 MED ORDER — SODIUM CHLORIDE 0.9% FLUSH: 3.0000 mL | INTRAVENOUS | Status: DC | PRN

## 2017-08-05 MED ORDER — FENTANYL CITRATE (PF) 100 MCG/2ML IJ SOLN
INTRAMUSCULAR | Status: AC
  Filled 2017-08-05: qty 2

## 2017-08-05 MED ORDER — EPHEDRINE SULFATE 50 MG/ML IJ SOLN
INTRAMUSCULAR | Status: DC | PRN
  Administered 2017-08-05 (×4): 10 mg via INTRAVENOUS

## 2017-08-05 MED ORDER — MORPHINE SULFATE (PF) 2 MG/ML IV SOLN: 1.0000 mg | INTRAVENOUS | Status: DC | PRN

## 2017-08-05 MED ORDER — ACETAMINOPHEN 325 MG PO TABS: 325.0000 mg | ORAL_TABLET | ORAL | Status: DC | PRN

## 2017-08-05 MED ORDER — ACETAMINOPHEN 500 MG PO TABS: 1000.0000 mg | ORAL_TABLET | Freq: Four times a day (QID) | ORAL | Status: DC

## 2017-08-05 MED ORDER — ACETAMINOPHEN 500 MG PO TABS
1000.0000 mg | ORAL_TABLET | ORAL | Status: AC
  Administered 2017-08-05: 1000 mg via ORAL

## 2017-08-05 MED ORDER — CLINDAMYCIN PHOSPHATE 900 MG/50ML IV SOLN
INTRAVENOUS | Status: AC
  Filled 2017-08-05: qty 50

## 2017-08-05 MED ORDER — SODIUM CHLORIDE 0.9 % IV SOLN: 250.0000 mL | INTRAVENOUS | Status: DC | PRN

## 2017-08-05 MED ORDER — ROPIVACAINE HCL 5 MG/ML IJ SOLN
INTRAMUSCULAR | Status: DC | PRN
  Administered 2017-08-05 (×12): 5 mL via PERINEURAL

## 2017-08-05 MED ORDER — ACETAMINOPHEN 10 MG/ML IV SOLN: 1000.0000 mg | Freq: Once | INTRAVENOUS | Status: DC | PRN

## 2017-08-05 MED ORDER — ACETAMINOPHEN 500 MG PO TABS
ORAL_TABLET | ORAL | Status: AC
  Filled 2017-08-05: qty 2

## 2017-08-05 MED ORDER — KETOROLAC TROMETHAMINE 30 MG/ML IJ SOLN: 30.0000 mg | Freq: Four times a day (QID) | INTRAMUSCULAR | Status: DC

## 2017-08-05 MED ORDER — ONDANSETRON HCL 4 MG/2ML IJ SOLN: 4.0000 mg | Freq: Once | INTRAMUSCULAR | Status: DC | PRN

## 2017-08-05 MED ORDER — MIDAZOLAM HCL 2 MG/2ML IJ SOLN
1.0000 mg | INTRAMUSCULAR | Status: DC | PRN
  Administered 2017-08-05 (×2): 2 mg via INTRAVENOUS

## 2017-08-05 MED ORDER — PROPOFOL 10 MG/ML IV BOLUS
INTRAVENOUS | Status: DC | PRN
  Administered 2017-08-05: 50 mg via INTRAVENOUS
  Administered 2017-08-05: 200 mg via INTRAVENOUS

## 2017-08-05 MED ORDER — DEXAMETHASONE SODIUM PHOSPHATE 10 MG/ML IJ SOLN
INTRAMUSCULAR | Status: AC
  Filled 2017-08-05: qty 1

## 2017-08-05 MED ORDER — LIDOCAINE HCL (CARDIAC) PF 100 MG/5ML IV SOSY
PREFILLED_SYRINGE | INTRAVENOUS | Status: AC
  Filled 2017-08-05: qty 5

## 2017-08-05 MED ORDER — OXYCODONE HCL 5 MG PO TABS: 5.0000 mg | ORAL_TABLET | Freq: Once | ORAL | Status: DC | PRN

## 2017-08-05 MED ORDER — SUGAMMADEX SODIUM 500 MG/5ML IV SOLN
INTRAVENOUS | Status: DC | PRN
  Administered 2017-08-05: 500 mg via INTRAVENOUS

## 2017-08-05 MED ORDER — CLINDAMYCIN PHOSPHATE 900 MG/50ML IV SOLN
900.0000 mg | INTRAVENOUS | Status: AC
  Administered 2017-08-05: 900 mg via INTRAVENOUS

## 2017-08-05 MED ORDER — ONDANSETRON HCL 4 MG/2ML IJ SOLN
INTRAMUSCULAR | Status: DC | PRN
  Administered 2017-08-05: 4 mg via INTRAVENOUS

## 2017-08-05 MED ORDER — MIDAZOLAM HCL 2 MG/2ML IJ SOLN
INTRAMUSCULAR | Status: AC
  Filled 2017-08-05: qty 2

## 2017-08-05 MED ORDER — CHLORHEXIDINE GLUCONATE CLOTH 2 % EX PADS: 6.0000 | MEDICATED_PAD | Freq: Once | CUTANEOUS | Status: DC

## 2017-08-05 MED ORDER — IBUPROFEN 800 MG PO TABS: 800.0000 mg | ORAL_TABLET | Freq: Three times a day (TID) | ORAL | 0 refills | Status: DC | PRN

## 2017-08-05 MED ORDER — FENTANYL CITRATE (PF) 100 MCG/2ML IJ SOLN
50.0000 ug | INTRAMUSCULAR | Status: DC | PRN
  Administered 2017-08-05 (×2): 100 ug via INTRAVENOUS

## 2017-08-05 MED ORDER — ACETAMINOPHEN 650 MG RE SUPP: 650.0000 mg | RECTAL | Status: DC | PRN

## 2017-08-05 MED ORDER — OXYCODONE HCL 5 MG PO TABS: 5.0000 mg | ORAL_TABLET | ORAL | Status: DC | PRN

## 2017-08-05 MED ORDER — SODIUM CHLORIDE 0.9% FLUSH: 3.0000 mL | Freq: Two times a day (BID) | INTRAVENOUS | Status: DC

## 2017-08-05 MED ORDER — ACETAMINOPHEN 160 MG/5ML PO SOLN: 325.0000 mg | ORAL | Status: DC | PRN

## 2017-08-05 MED ORDER — PHENYLEPHRINE HCL 10 MG/ML IJ SOLN
INTRAMUSCULAR | Status: DC | PRN
  Administered 2017-08-05: 120 ug via INTRAVENOUS

## 2017-08-05 MED ORDER — ONDANSETRON HCL 4 MG/2ML IJ SOLN
INTRAMUSCULAR | Status: AC
  Filled 2017-08-05: qty 2

## 2017-08-05 MED ORDER — FENTANYL CITRATE (PF) 100 MCG/2ML IJ SOLN
25.0000 ug | INTRAMUSCULAR | Status: DC | PRN
  Administered 2017-08-05: 25 ug via INTRAVENOUS
  Administered 2017-08-05: 50 ug via INTRAVENOUS
  Administered 2017-08-05 (×2): 25 ug via INTRAVENOUS

## 2017-08-05 MED ORDER — BUPIVACAINE-EPINEPHRINE 0.25% -1:200000 IJ SOLN
INTRAMUSCULAR | Status: DC | PRN
  Administered 2017-08-05: 10 mL

## 2017-08-05 MED ORDER — OXYCODONE HCL 5 MG PO TABS: 5.0000 mg | ORAL_TABLET | Freq: Four times a day (QID) | ORAL | 0 refills | Status: DC | PRN

## 2017-08-05 MED ORDER — LACTATED RINGERS IV SOLN
INTRAVENOUS | Status: DC
  Administered 2017-08-05 (×2): via INTRAVENOUS

## 2017-08-05 MED ORDER — ACETAMINOPHEN 325 MG PO TABS: 650.0000 mg | ORAL_TABLET | ORAL | Status: DC | PRN

## 2017-08-05 MED ORDER — DEXAMETHASONE SODIUM PHOSPHATE 4 MG/ML IJ SOLN
INTRAMUSCULAR | Status: DC | PRN
  Administered 2017-08-05: 10 mg via INTRAVENOUS

## 2017-08-05 MED ORDER — OXYCODONE HCL 5 MG/5ML PO SOLN: 5.0000 mg | Freq: Once | ORAL | Status: DC | PRN

## 2017-08-05 MED ORDER — CELECOXIB 400 MG PO CAPS
400.0000 mg | ORAL_CAPSULE | ORAL | Status: AC
  Administered 2017-08-05: 400 mg via ORAL

## 2017-08-05 MED ORDER — PROPOFOL 10 MG/ML IV BOLUS
INTRAVENOUS | Status: AC
  Filled 2017-08-05: qty 20

## 2017-08-05 MED ORDER — GABAPENTIN 300 MG PO CAPS
ORAL_CAPSULE | ORAL | Status: AC
  Filled 2017-08-05: qty 1

## 2017-08-05 SURGICAL SUPPLY — 51 items
BLADE CLIPPER SURG (BLADE) ×3 IMPLANT
BLADE SURG 10 STRL SS (BLADE) ×3 IMPLANT
BLADE SURG 15 STRL LF DISP TIS (BLADE) ×1 IMPLANT
BLADE SURG 15 STRL SS (BLADE) ×2
CANISTER SUCT 1200ML W/VALVE (MISCELLANEOUS) IMPLANT
CHLORAPREP W/TINT 26ML (MISCELLANEOUS) ×3 IMPLANT
COVER BACK TABLE 60X90IN (DRAPES) ×3 IMPLANT
COVER MAYO STAND STRL (DRAPES) ×3 IMPLANT
DERMABOND ADVANCED (GAUZE/BANDAGES/DRESSINGS) ×2
DERMABOND ADVANCED .7 DNX12 (GAUZE/BANDAGES/DRESSINGS) ×1 IMPLANT
DRAIN PENROSE 1/2X12 LTX STRL (WOUND CARE) ×3 IMPLANT
DRAPE LAPAROTOMY TRNSV 102X78 (DRAPE) ×3 IMPLANT
DRAPE UTILITY XL STRL (DRAPES) ×3 IMPLANT
ELECT COATED BLADE 2.86 ST (ELECTRODE) ×3 IMPLANT
ELECT REM PT RETURN 9FT ADLT (ELECTROSURGICAL) ×3
ELECTRODE REM PT RTRN 9FT ADLT (ELECTROSURGICAL) ×1 IMPLANT
GAUZE SPONGE 4X4 12PLY STRL LF (GAUZE/BANDAGES/DRESSINGS) IMPLANT
GAUZE SPONGE 4X4 16PLY XRAY LF (GAUZE/BANDAGES/DRESSINGS) IMPLANT
GLOVE BIO SURGEON STRL SZ 6.5 (GLOVE) ×2 IMPLANT
GLOVE BIO SURGEONS STRL SZ 6.5 (GLOVE) ×1
GLOVE BIOGEL PI IND STRL 7.0 (GLOVE) ×1 IMPLANT
GLOVE BIOGEL PI IND STRL 8 (GLOVE) ×1 IMPLANT
GLOVE BIOGEL PI INDICATOR 7.0 (GLOVE) ×2
GLOVE BIOGEL PI INDICATOR 8 (GLOVE) ×2
GLOVE ECLIPSE 8.0 STRL XLNG CF (GLOVE) ×3 IMPLANT
GOWN STRL REUS W/ TWL LRG LVL3 (GOWN DISPOSABLE) ×2 IMPLANT
GOWN STRL REUS W/TWL LRG LVL3 (GOWN DISPOSABLE) ×4
MESH HERNIA SYS ULTRAPRO LRG (Mesh General) ×6 IMPLANT
NEEDLE HYPO 25X1 1.5 SAFETY (NEEDLE) ×3 IMPLANT
NS IRRIG 1000ML POUR BTL (IV SOLUTION) ×3 IMPLANT
PACK BASIN DAY SURGERY FS (CUSTOM PROCEDURE TRAY) ×3 IMPLANT
PENCIL BUTTON HOLSTER BLD 10FT (ELECTRODE) ×3 IMPLANT
SLEEVE SCD COMPRESS KNEE MED (MISCELLANEOUS) ×3 IMPLANT
SPONGE LAP 4X18 RFD (DISPOSABLE) ×3 IMPLANT
STAPLER VISISTAT 35W (STAPLE) IMPLANT
SUT MON AB 4-0 PC3 18 (SUTURE) ×6 IMPLANT
SUT NOVA 0 T19/GS 22DT (SUTURE) ×15 IMPLANT
SUT VIC AB 0 SH 27 (SUTURE) ×6 IMPLANT
SUT VIC AB 2-0 SH 27 (SUTURE) ×4
SUT VIC AB 2-0 SH 27XBRD (SUTURE) ×2 IMPLANT
SUT VIC AB 3-0 54X BRD REEL (SUTURE) IMPLANT
SUT VIC AB 3-0 BRD 54 (SUTURE)
SUT VICRYL 3-0 CR8 SH (SUTURE) ×6 IMPLANT
SUT VICRYL AB 2 0 TIE (SUTURE) IMPLANT
SUT VICRYL AB 2 0 TIES (SUTURE)
SYR CONTROL 10ML LL (SYRINGE) ×3 IMPLANT
TOWEL GREEN STERILE FF (TOWEL DISPOSABLE) ×6 IMPLANT
TOWEL OR NON WOVEN STRL DISP B (DISPOSABLE) ×3 IMPLANT
TUBE CONNECTING 20'X1/4 (TUBING)
TUBE CONNECTING 20X1/4 (TUBING) IMPLANT
YANKAUER SUCT BULB TIP NO VENT (SUCTIONS) IMPLANT

## 2017-08-05 NOTE — Op Note (Signed)
Preoperative diagnosis: Bilateral inguinal hernia  Postoperative diagnosis: Same  Procedure: Bilateral inguinal hernia repair with mesh  Surgeon: Erroll Luna, MD  Anesthesia: General with bilateral TAP blocks and local anesthetic  Drains: None  EBL: 10 cc  Specimens: None  IV fluids: Per anesthesia record  Indications for procedure: The patient presents for repair of bilateral inguinal hernia.  We discussed laparoscopic versus open approaches and the use of mesh.  We discussed the pros and cons of mesh use and potential complications of mesh use.  We discussed the rationale for using mesh.  We discussed recurrence rates and contrasted both procedures.  After lengthy discussion he opted for open bilateral internal hernia repair with mesh.The risk of hernia repair include bleeding,  Infection,   Recurrence of the hernia,  Mesh use, chronic pain,  Organ injury,  Bowel injury,  Bladder injury,   nerve injury with numbness around the incision,  Death,  and worsening of preexisting  medical problems.  The alternatives to surgery have been discussed as well..  Long term expectations of both operative and non operative treatments have been discussed.   The patient agrees to proceed.    Description of procedure: The patient was met in the holding area.  Questions were answered.  He underwent bilateral blocks by anesthesia.  He was taken back to the operating room.  He was placed supine upon the operating room table.  After induction of general anesthesia both inguinal regions were prepped and draped in sterile fashion and timeout was done.  Proper patient and procedure were discussed.  The left side was done first.  A left inguinal incision was made.  Dissection was carried down through Scarpa's fascia to the aponeurosis of the external oblique.  These fibers were opened in the direction of the fibers through the external ring.  Cord structures were encircled with 1/4 inch Penrose drain the large  direct hernia was identified and dissected off the cord.  There is no indirect hernia.  The ilioinguinal nerve was retracted out of the field.  We opened the floor of the anal canal.  The preperitoneal space was entered bluntly without difficulty.  A large ultra pro hernia system mesh was used with the inner leaflet placed in the preperitoneal space.  The onlay was secured to the shelving edge of the inguinal ligament, the pubic tubercle and the internal oblique with #0 Novafil.  A slit was cut for the cord structures and the cord was allowed to exit through this.  This was closed without any undue tension and did not constrict the cord.  The ilioinguinal nerve was tethered in the mesh and therefore was divided as it exited the muscle on the left side.  The mesh laid nicely with good closure no undue tension and no undue constriction of the cord.  The iliohypogastric and genitofemoral branch of the femoral nerve were preserved.  The aponeurosis of the external oblique was approximately 2-0 Vicryl.  3-0 Vicryl was used to approximate Scarpa's fashion for Monocryl was used to close the skin.  The right side was then done.  In a similar fashion the right inguinal incision was made.  Dissection was carried down through Scarpa's fascia until the aponeurosis of the external oblique was identified.  It was opened in the direction of the fibers through the external ring.  The cord structures were encircled with 1/4 inch Penrose drain and he had a large direct inguinal hernia on the right.  The floor was opened and  the preperitoneal space was entered.  A large ultra pro hernia system mesh was used with the inner leaflet placed in the preperitoneal space and the onlay placed onto the floor of the inguinal canal.  A slit was cut for the cord structures.  It was secured with 0 Novafil to the shelving edge of the inguinal ligament pubic tubercle and the internal oblique.  The ilioinguinal nerve was tethered under the mesh upon  closure of this and this was divided as it exited the muscle.  The iliohypogastric and genital branch of the femoral nerve were preserved.  Slit was closed with 0 Novafil.  There is ample room for the cord exit.  It laid nicely with no tension and no constriction of the cord.  The aponeurosis of the external oblique was closed with 2-0 Vicryl.  3-0 Vicryl was used to close Scarpa's fascia and 4-0 Monocryl was used to close the skin in a subcuticular fashion.  Local anesthetic was infiltrated into both incisions consisting of 0.25% Sensorcaine with epinephrine.  Dermabond was applied to both.  All final counts were found to be correct.  The patient was awoke extubated taken recovery in satisfactory condition.

## 2017-08-05 NOTE — Anesthesia Preprocedure Evaluation (Signed)
Anesthesia Evaluation  Patient identified by MRN, date of birth, ID band Patient awake    Reviewed: Allergy & Precautions, NPO status , Patient's Chart, lab work & pertinent test results  Airway Mallampati: I  TM Distance: >3 FB Neck ROM: Full    Dental no notable dental hx. (+) Teeth Intact   Pulmonary sleep apnea and Continuous Positive Airway Pressure Ventilation , former smoker,    Pulmonary exam normal breath sounds clear to auscultation       Cardiovascular hypertension, Pt. on medications Normal cardiovascular exam Rhythm:Regular Rate:Normal  ECG: SR, rate 81   Neuro/Psych PSYCHIATRIC DISORDERS Anxiety Depression negative neurological ROS     GI/Hepatic negative GI ROS, Neg liver ROS,   Endo/Other  negative endocrine ROSHypothyroidism   Renal/GU Renal InsufficiencyRenal diseasenegative Renal ROS     Musculoskeletal  (+) Arthritis , Osteoarthritis,    Abdominal Normal abdominal exam  (+)   Peds  Hematology  (+) anemia ,   Anesthesia Other Findings   Reproductive/Obstetrics                             Anesthesia Physical  Anesthesia Plan  ASA: II  Anesthesia Plan: General   Post-op Pain Management:  Regional for Post-op pain   Induction: Intravenous  PONV Risk Score and Plan: 2 and Ondansetron, Dexamethasone and Midazolam  Airway Management Planned: Oral ETT  Additional Equipment:   Intra-op Plan:   Post-operative Plan: Extubation in OR  Informed Consent: I have reviewed the patients History and Physical, chart, labs and discussed the procedure including the risks, benefits and alternatives for the proposed anesthesia with the patient or authorized representative who has indicated his/her understanding and acceptance.   Dental advisory given  Plan Discussed with: CRNA  Anesthesia Plan Comments:         Anesthesia Quick Evaluation

## 2017-08-05 NOTE — Anesthesia Procedure Notes (Signed)
Anesthesia Regional Block: TAP block   Pre-Anesthetic Checklist: ,, timeout performed, Correct Patient, Correct Site, Correct Laterality, Correct Procedure, Correct Position, site marked, Risks and benefits discussed,  Surgical consent,  Pre-op evaluation,  At surgeon's request and post-op pain management  Laterality: Right and N/A  Prep: chloraprep       Needles:  Injection technique: Single-shot  Needle Type: Echogenic Stimulator Needle     Needle Length: 9cm  Needle Gauge: 21   Needle insertion depth: 2 cm   Additional Needles:   Procedures:,,,, ultrasound used (permanent image in chart),,,,  Narrative:  Start time: 08/05/2017 9:00 AM End time: 08/05/2017 9:10 AM Injection made incrementally with aspirations every 5 mL.  Performed by: Personally  Anesthesiologist: Leilani AbleHatchett, Dewie Ahart, MD

## 2017-08-05 NOTE — Discharge Instructions (Signed)
[facility] _______[facility] ° °UMBILICAL OR INGUINAL HERNIA REPAIR: POST OP INSTRUCTIONS ° °Always review your discharge instruction sheet given to you by the facility where your surgery was performed. °IF YOU HAVE DISABILITY OR FAMILY LEAVE FORMS, YOU MUST BRING THEM TO THE OFFICE FOR PROCESSING.   °DO NOT GIVE THEM TO YOUR DOCTOR. ° °1. A  prescription for pain medication may be given to you upon discharge.  Take your pain medication as prescribed, if needed.  If narcotic pain medicine is not needed, then you may take acetaminophen (Tylenol) or ibuprofen (Advil) as needed. °2. Take your usually prescribed medications unless otherwise directed. °If you need a refill on your pain medication, please contact your pharmacy.  They will contact our office to request authorization. Prescriptions will not be filled after 5 pm or on week-ends. °3. You should follow a light diet the first 24 hours after arrival home, such as soup and crackers, etc.  Be sure to include lots of fluids daily.  Resume your normal diet the day after surgery. °4.Most patients will experience some swelling and bruising around the umbilicus or in the groin and scrotum.  Ice packs and reclining will help.  Swelling and bruising can take several days to resolve.  °6. It is common to experience some constipation if taking pain medication after surgery.  Increasing fluid intake and taking a stool softener (such as Colace) will usually help or prevent this problem from occurring.  A mild laxative (Milk of Magnesia or Miralax) should be taken according to package directions if there are no bowel movements after 48 hours. °7. Unless discharge instructions indicate otherwise, you may remove your bandages 24-48 hours after surgery, and you may shower at that time.  You may have steri-strips (small skin tapes) in place directly over the incision.  These strips should be left on the skin for 7-10 days.  If your surgeon used skin glue on the  incision, you may shower in 24 hours.  The glue will flake off over the next 2-3 weeks.  Any sutures or staples will be removed at the office during your follow-up visit. °8. ACTIVITIES:  You may resume regular (light) daily activities beginning the next day--such as daily self-care, walking, climbing stairs--gradually increasing activities as tolerated.  You may have sexual intercourse when it is comfortable.  Refrain from any heavy lifting or straining until approved by your doctor. ° °a.You may drive when you are no longer taking prescription pain medication, you can comfortably wear a seatbelt, and you can safely maneuver your car and apply brakes. °b.RETURN TO WORK:   °_____________________________________________ ° °9.You should see your doctor in the office for a follow-up appointment approximately 2-3 weeks after your surgery.  Make sure that you call for this appointment within a day or two after you arrive home to insure a convenient appointment time. °10.OTHER INSTRUCTIONS: _________________________ °   _____________________________________ ° °WHEN TO CALL YOUR DOCTOR: °1. Fever over 101.0 °2. Inability to urinate °3. Nausea and/or vomiting °4. Extreme swelling or bruising °5. Continued bleeding from incision. °6. Increased pain, redness, or drainage from the incision ° °The clinic staff is available to answer your questions during regular business hours.  Please don’t hesitate to call and ask to speak to one of the nurses for clinical concerns.  If you have a medical emergency, go to the nearest emergency room or call 911.  A surgeon from [facility] is always on call at the hospital ° ° °  [address], [address], [place], [place]  [address] ? ° [address] [address], [place], [place]   [address] °[phone] ? [phone] ? FAX [phone] °Web site: [email or web address] ° ° °Post Anesthesia Home Care Instructions ° °Activity: °Get plenty of rest for the remainder of the day. A  responsible individual must stay with you for 24 hours following the procedure.  °For the next 24 hours, DO NOT: °-Drive a car °-Operate machinery °-Drink alcoholic beverages °-Take any medication unless instructed by your physician °-Make any legal decisions or sign important papers. ° °Meals: °Start with liquid foods such as gelatin or soup. Progress to regular foods as tolerated. Avoid greasy, spicy, heavy foods. If nausea and/or vomiting occur, drink only clear liquids until the nausea and/or vomiting subsides. Call your physician if vomiting continues. ° °Special Instructions/Symptoms: °Your throat may feel dry or sore from the anesthesia or the breathing tube placed in your throat during surgery. If this causes discomfort, gargle with warm salt water. The discomfort should disappear within 24 hours. ° °If you had a scopolamine patch placed behind your ear for the management of post- operative nausea and/or vomiting: ° °1. The medication in the patch is effective for 72 hours, after which it should be removed.  Wrap patch in a tissue and discard in the trash. Wash hands thoroughly with soap and water. °2. You may remove the patch earlier than 72 hours if you experience unpleasant side effects which may include dry mouth, dizziness or visual disturbances. °3. Avoid touching the patch. Wash your hands with soap and water after contact with the patch. °  ° °

## 2017-08-05 NOTE — Anesthesia Procedure Notes (Signed)
Procedure Name: Intubation Performed by: Verita Lamb, CRNA Pre-anesthesia Checklist: Patient identified, Emergency Drugs available, Suction available, Patient being monitored and Timeout performed Patient Re-evaluated:Patient Re-evaluated prior to induction Oxygen Delivery Method: Circle system utilized Preoxygenation: Pre-oxygenation with 100% oxygen Induction Type: IV induction Ventilation: Mask ventilation without difficulty Laryngoscope Size: Mac and 3 (recommend mac 4) Grade View: Grade III Tube type: Oral Tube size: 7.0 mm Number of attempts: 1 Airway Equipment and Method: Stylet Placement Confirmation: positive ETCO2,  ETT inserted through vocal cords under direct vision,  CO2 detector and breath sounds checked- equal and bilateral Secured at: 22 cm Tube secured with: Tape Dental Injury: Teeth and Oropharynx as per pre-operative assessment  Comments: Smooth iv induction, easy mask vent with oral airway. Used mac 3 blade and could only visualize the epiglottis which was very large.  Used firm cricoid pressure and was able to insert ett by lifting epiglottis with ett.  Recommend mac 4 in the future. mkelly

## 2017-08-05 NOTE — Progress Notes (Signed)
Assisted Dr. Arby BarretteHatchett with right, left, ultrasound guided, transabdominal plane block. Side rails up, monitors on throughout procedure. See vital signs in flow sheet. Tolerated Procedure well.

## 2017-08-05 NOTE — Transfer of Care (Signed)
Immediate Anesthesia Transfer of Care Note  Patient: Christian Snyder  Procedure(s) Performed: OPEN BILATERAL INGUINAL HERNIA REPAIR ERAS PATHWAY (Bilateral Groin) INSERTION OF MESH (Bilateral Groin)  Patient Location: PACU  Anesthesia Type:General  Level of Consciousness: awake, alert  and oriented  Airway & Oxygen Therapy: Patient Spontanous Breathing and Patient connected to face mask oxygen  Post-op Assessment: Report given to RN and Post -op Vital signs reviewed and stable  Post vital signs: Reviewed and stable  Last Vitals:  Vitals Value Taken Time  BP    Temp    Pulse    Resp    SpO2      Last Pain:  Vitals:   08/05/17 0841  TempSrc: Oral  PainSc: 0-No pain         Complications: No apparent anesthesia complications

## 2017-08-05 NOTE — Interval H&P Note (Signed)
History and Physical Interval Note:  08/05/2017 9:19 AM  Christian Snyder  has presented today for surgery, with the diagnosis of Bilateral inguinal hernia  The various methods of treatment have been discussed with the patient and family. After consideration of risks, benefits and other options for treatment, the patient has consented to  Procedure(s): OPEN BILATERAL INGUINAL HERNIA REPAIR ERAS PATHWAY (Bilateral) INSERTION OF MESH (Bilateral) as a surgical intervention .  The patient's history has been reviewed, patient examined, no change in status, stable for surgery.  I have reviewed the patient's chart and labs.  Questions were answered to the patient's satisfaction.     [name] A [name]

## 2017-08-05 NOTE — Anesthesia Postprocedure Evaluation (Signed)
Anesthesia Post Note  Patient: Curlene LabrumRonald Hashman  Procedure(s) Performed: OPEN BILATERAL INGUINAL HERNIA REPAIR ERAS PATHWAY (Bilateral Groin) INSERTION OF MESH (Bilateral Groin)     Patient location during evaluation: PACU Anesthesia Type: General Level of consciousness: awake Pain management: pain level controlled Vital Signs Assessment: post-procedure vital signs reviewed and stable Respiratory status: spontaneous breathing Cardiovascular status: stable Postop Assessment: no apparent nausea or vomiting Anesthetic complications: no    Last Vitals:  Vitals:   08/05/17 1227 08/05/17 1230  BP:  (!) 120/56  Pulse: 82 77  Resp: (!) 24 13  Temp:    SpO2: 93% 98%    Last Pain:  Vitals:   08/05/17 1230  TempSrc:   PainSc: 3    Pain Goal:                 Kashmir Leedy JR,JOHN Zadrian Mccauley

## 2017-08-05 NOTE — Anesthesia Procedure Notes (Signed)
Anesthesia Regional Block: TAP block   Pre-Anesthetic Checklist: ,, timeout performed, Correct Patient, Correct Site, Correct Laterality, Correct Procedure, Correct Position, site marked, Risks and benefits discussed,  Surgical consent,  Pre-op evaluation,  At surgeon's request and post-op pain management  Laterality: Left and N/A  Prep: chloraprep       Needles:  Injection technique: Single-shot  Needle Type: Echogenic Stimulator Needle     Needle Length: 9cm  Needle Gauge: 21   Needle insertion depth: 2 cm   Additional Needles:   Procedures:,,,, ultrasound used (permanent image in chart),,,,  Narrative:  Start time: 08/05/2017 9:12 AM End time: 08/05/2017 9:22 AM Injection made incrementally with aspirations every 5 mL.  Performed by: Personally  Anesthesiologist: Leilani AbleHatchett, Ariaunna Longsworth, MD

## 2017-08-06 ENCOUNTER — Encounter (HOSPITAL_BASED_OUTPATIENT_CLINIC_OR_DEPARTMENT_OTHER): Payer: Self-pay | Admitting: Surgery

## 2017-08-06 NOTE — Addendum Note (Signed)
Addendum  created 08/06/17 1030 by Netha Dafoe, Jewel Baizeimothy D, CRNA   Charge Capture section accepted

## 2017-12-23 DIAGNOSIS — H2513 Age-related nuclear cataract, bilateral: Secondary | ICD-10-CM | POA: Diagnosis not present

## 2018-03-02 DIAGNOSIS — R5383 Other fatigue: Secondary | ICD-10-CM | POA: Diagnosis not present

## 2018-03-02 DIAGNOSIS — R42 Dizziness and giddiness: Secondary | ICD-10-CM | POA: Diagnosis not present

## 2018-03-25 DIAGNOSIS — G4733 Obstructive sleep apnea (adult) (pediatric): Secondary | ICD-10-CM | POA: Diagnosis not present

## 2018-04-01 DIAGNOSIS — S76219A Strain of adductor muscle, fascia and tendon of unspecified thigh, initial encounter: Secondary | ICD-10-CM | POA: Diagnosis not present

## 2018-04-15 DIAGNOSIS — M25551 Pain in right hip: Secondary | ICD-10-CM | POA: Diagnosis not present

## 2018-04-28 DIAGNOSIS — M1611 Unilateral primary osteoarthritis, right hip: Secondary | ICD-10-CM | POA: Diagnosis not present

## 2018-04-29 DIAGNOSIS — M25551 Pain in right hip: Secondary | ICD-10-CM | POA: Diagnosis not present

## 2018-04-30 ENCOUNTER — Ambulatory Visit: Payer: Self-pay | Admitting: Physician Assistant

## 2018-04-30 ENCOUNTER — Encounter (HOSPITAL_COMMUNITY): Payer: Self-pay | Admitting: *Deleted

## 2018-04-30 ENCOUNTER — Other Ambulatory Visit: Payer: Self-pay

## 2018-04-30 DIAGNOSIS — M25551 Pain in right hip: Secondary | ICD-10-CM | POA: Diagnosis not present

## 2018-04-30 DIAGNOSIS — S72001A Fracture of unspecified part of neck of right femur, initial encounter for closed fracture: Secondary | ICD-10-CM | POA: Diagnosis not present

## 2018-04-30 NOTE — Progress Notes (Signed)
Anesthesia Chart Review   Case:  [id number] Date/Time:  05/01/18 1010   Procedure:  PERCUTANEOUS HIP PINNING (Right )   Anesthesia type:  Choice   Pre-op diagnosis:  RIGHT HIP FRACTURE   Location:  [facility]   Surgeon:  Frederico Hamman, MD      DISCUSSION: 68 yo former smoker (quit 01/22/15) with h/o HTN, anxiety, depression, sleep apnea w/CPAP, hypothyroidism, right hip fracture scheduled for above procedure 05/01/18 with Dr. Frederico Hamman.   Anesthesia Records reviewed, pt underwent inguinal hernia repair 08/05/17, general anesthesia without complications.  DOS EKG.  Pt can proceed with planned procedure barring acute status change and after evaluation DOS (same day workup).    VS: There were no vitals taken for this visit.  PROVIDERS: Catha Gosselin, MD is PCP    LABS: Labs DOS (all labs ordered are listed, but only abnormal results are displayed)  Labs Reviewed - No data to display   IMAGES:   EKG: 10/08/17 Rate 81 bpm Sinus rhythm  Within normal limits   CV:  Past Medical History:  Diagnosis Date  . Anxiety   . Complication of anesthesia    patient states cold after anesthesia   . Depression   . Early cataract   . Essential hypertension   . Hypothyroidism   . Primary localized osteoarthritis of right knee   . Primary localized osteoarthritis of right knee   . Right inguinal hernia   . Sleep apnea    [facility] dr. [name] wears CPAP at night  . Thyroid disease   . Wears glasses     Past Surgical History:  Procedure Laterality Date  . COLONOSCOPY     2x  . dental implants    . INGUINAL HERNIA REPAIR Bilateral 08/05/2017   Procedure: OPEN BILATERAL INGUINAL HERNIA REPAIR ERAS PATHWAY;  Surgeon: [name], MD;  Location: [facility];  Service: General;  Laterality: Bilateral;  . INSERTION OF MESH Bilateral 08/05/2017   Procedure: INSERTION OF MESH;  Surgeon: [name], MD;  Location: [facility];   Service: General;  Laterality: Bilateral;  . KNEE ARTHROSCOPY W/ ACL RECONSTRUCTION Right 1989   4 procedures related to this   . TONSILLECTOMY  1960  . 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 TOTAL KNEE ARTHROPLASTY Right 12/02/2016   Procedure: TOTAL KNEE ARTHROPLASTY;  Surgeon: [name], MD;  Location: [facility] OR;  Service: Orthopedics;  Laterality: Right;    MEDICATIONS: No current facility-administered medications for this encounter.    Marland Kitchen acetaminophen (TYLENOL) 325 MG tablet  . amLODipine (NORVASC) 5 MG tablet  . buPROPion (WELLBUTRIN XL) 300 MG 24 hr tablet  . ibuprofen (ADVIL,MOTRIN) 200 MG tablet  . levothyroxine (SYNTHROID, LEVOTHROID) 112 MCG tablet  . lisinopril (PRINIVIL,ZESTRIL) 40 MG tablet  . Multiple Vitamin (MULTIVITAMIN WITH MINERALS) TABS tablet  . Omega-3 Fatty Acids (FISH OIL) 1000 MG CAPS  . Red Yeast Rice 600 MG CAPS  . vitamin E 400 UNIT capsule    Janey Genta [facility] Pre-Surgical Testing [phone] 04/30/18 4:41 PM

## 2018-04-30 NOTE — Anesthesia Preprocedure Evaluation (Addendum)
Anesthesia Evaluation  Patient identified by MRN, date of birth, ID band Patient awake    Reviewed: Allergy & Precautions, H&P , NPO status , Patient's Chart, lab work & pertinent test results  Airway Mallampati: II  TM Distance: >3 FB Neck ROM: Full    Dental no notable dental hx. (+) Teeth Intact, Dental Advisory Given   Pulmonary sleep apnea and Continuous Positive Airway Pressure Ventilation , former smoker,    Pulmonary exam normal breath sounds clear to auscultation       Cardiovascular hypertension, Pt. on medications  Rhythm:Regular Rate:Normal     Neuro/Psych Anxiety Depression negative neurological ROS     GI/Hepatic negative GI ROS, Neg liver ROS,   Endo/Other  Hypothyroidism   Renal/GU Renal InsufficiencyRenal disease  negative genitourinary   Musculoskeletal  (+) Arthritis ,   Abdominal   Peds  Hematology negative hematology ROS (+)   Anesthesia Other Findings   Reproductive/Obstetrics negative OB ROS                         Anesthesia Physical Anesthesia Plan  ASA: III  Anesthesia Plan: General   Post-op Pain Management:    Induction: Intravenous  PONV Risk Score and Plan: 3 and Ondansetron, Dexamethasone and Midazolam  Airway Management Planned: Oral ETT  Additional Equipment:   Intra-op Plan:   Post-operative Plan: Extubation in OR  Informed Consent: I have reviewed the patients History and Physical, chart, labs and discussed the procedure including the risks, benefits and alternatives for the proposed anesthesia with the patient or authorized representative who has indicated his/her understanding and acceptance.     Dental advisory given  Plan Discussed with: CRNA  Anesthesia Plan Comments: (See PAT note 04/30/18, Jodell Cipro, PA-C (same day workup))       Anesthesia Quick Evaluation

## 2018-04-30 NOTE — H&P (Signed)
Christian Snyder is an 68 y.o. male.   Chief Complaint: nondisplaced right hip fracture  HPI: pt presented to office with c/o R hip pain following a fall 3-4 weeks ago.  xrays showed no obvious fracture pt was sent for MR arthrogram of R hip which revealed acute or subacute nondisplaced subcapital fracture.    Past Medical History:  Diagnosis Date  . Anxiety   . Complication of anesthesia    patient states cold after anesthesia   . Depression   . Early cataract   . Essential hypertension   . Hypothyroidism   . Primary localized osteoarthritis of right knee   . Primary localized osteoarthritis of right knee   . Right inguinal hernia   . Sleep apnea    [facility] dr. Earl Snyder wears CPAP at night  . Thyroid disease   . Wears glasses     Past Surgical History:  Procedure Laterality Date  . COLONOSCOPY     2x  . dental implants    . INGUINAL HERNIA REPAIR Bilateral 08/05/2017   Procedure: OPEN BILATERAL INGUINAL HERNIA REPAIR ERAS PATHWAY;  Surgeon: Christian Bouillon, MD;  Location: [facility];  Service: General;  Laterality: Bilateral;  . INSERTION OF MESH Bilateral 08/05/2017   Procedure: INSERTION OF MESH;  Surgeon: Christian Bouillon, MD;  Location: [facility];  Service: General;  Laterality: Bilateral;  . KNEE ARTHROSCOPY W/ ACL RECONSTRUCTION Right 1989   4 procedures related to this   . TONSILLECTOMY  1960  . 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 Hypertension Father   . Cancer Mother    Social History:  reports that he quit smoking about 3 years ago. He started smoking about 49 years ago. He has never used smokeless tobacco. He reports previous alcohol use. He reports that he does not use drugs.  Allergies:  Allergies   Allergen Reactions  . Celexa [Citalopram]     Sleepiness, forgetfullness  . Penicillins Hives    Has patient had a PCN reaction causing immediate rash, facial/tongue/throat swelling, SOB or lightheadedness with hypotension: Unknown Has patient had a PCN reaction causing severe rash involving mucus membranes or skin necrosis: Unknown Has patient had a PCN reaction that required hospitalization: Unknown Has patient had a PCN reaction occurring within the last 10 years: No If all of the above answers are "NO", then may proceed with Cephalosporin use.   . Pravastatin     Fatigue, muscle aches    (Not in a hospital admission)   No results found for this or any previous visit (from the past 48 hour(s)). No results found.  Review of Systems  Musculoskeletal: Positive for falls and joint pain.  All other systems reviewed and are negative.   There were no vitals taken for this visit. Physical Exam  Constitutional: He is oriented to person, place, and time. He appears well-developed and well-nourished. No distress.  HENT:  Head: Normocephalic and atraumatic.  Nose: Nose normal.  Eyes: Pupils are equal, round, and reactive to light. Conjunctivae and EOM are normal.  Neck: Normal range of motion. Neck supple.  Cardiovascular: Normal rate, regular rhythm, normal heart sounds and intact distal pulses.  Respiratory: Effort normal and breath sounds normal. No respiratory distress. He has no wheezes.  GI: Soft. Bowel sounds are normal. He exhibits no distension. There is no abdominal tenderness.  Musculoskeletal:     Right hip: He exhibits decreased range of motion, decreased strength, tenderness and bony tenderness.  Neurological: He is alert and oriented to person, place, and time.  Skin: Skin is warm and dry. No rash noted. No erythema.  Psychiatric: He has a normal mood and affect. His behavior is normal.     Assessment/Plan Nondisplaced R hip fracture  Discussed operative vs  nonoperative management of the hip fracture patient wishes to proceed with ORIF percutaneous pinning R hip.  This weill be done at least with an overnight in the hospital as soon as possible.  Patient was instructed to be nonweight bearing RLE, he refused walker but does have crutches.    Christian Fluegel, PA-C 04/30/2018, 5:04 PM   

## 2018-04-30 NOTE — H&P (View-Only) (Signed)
Christian Snyder is an 68 y.o. male.   Chief Complaint: nondisplaced right hip fracture  HPI: pt presented to office with c/o R hip pain following a fall 3-4 weeks ago.  xrays showed no obvious fracture pt was sent for MR arthrogram of R hip which revealed acute or subacute nondisplaced subcapital fracture.    Past Medical History:  Diagnosis Date  . Anxiety   . Complication of anesthesia    patient states cold after anesthesia   . Depression   . Early cataract   . Essential hypertension   . Hypothyroidism   . Primary localized osteoarthritis of right knee   . Primary localized osteoarthritis of right knee   . Right inguinal hernia   . Sleep apnea    [facility] dr. [name] wears CPAP at night  . Thyroid disease   . Wears glasses     Past Surgical History:  Procedure Laterality Date  . COLONOSCOPY     2x  . dental implants    . INGUINAL HERNIA REPAIR Bilateral 08/05/2017   Procedure: OPEN BILATERAL INGUINAL HERNIA REPAIR ERAS PATHWAY;  Surgeon: [name], MD;  Location: [facility];  Service: General;  Laterality: Bilateral;  . INSERTION OF MESH Bilateral 08/05/2017   Procedure: INSERTION OF MESH;  Surgeon: [name], MD;  Location: [facility];  Service: General;  Laterality: Bilateral;  . KNEE ARTHROSCOPY W/ ACL RECONSTRUCTION Right 1989   4 procedures related to this   . TONSILLECTOMY  1960  . 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 TOTAL KNEE ARTHROPLASTY Right 12/02/2016   Procedure: TOTAL KNEE ARTHROPLASTY;  Surgeon: [name], MD;  Location: [facility] OR;  Service: Orthopedics;  Laterality: Right;    Family History  Problem Relation Age of Onset  . Hypertension Father   . Cancer Mother    Social History:  reports that he quit smoking about 3 years ago. He started smoking about 49 years ago. He has never used smokeless tobacco. He reports previous alcohol use. He reports that he does not use drugs.  Allergies:  Allergies   Allergen Reactions  . Celexa [Citalopram]     Sleepiness, forgetfullness  . Penicillins Hives    Has patient had a PCN reaction causing immediate rash, facial/tongue/throat swelling, SOB or lightheadedness with hypotension: Unknown Has patient had a PCN reaction causing severe rash involving mucus membranes or skin necrosis: Unknown Has patient had a PCN reaction that required hospitalization: Unknown Has patient had a PCN reaction occurring within the last 10 years: No If all of the above answers are "NO", then may proceed with Cephalosporin use.   . Pravastatin     Fatigue, muscle aches    (Not in a hospital admission)   No results found for this or any previous visit (from the past 48 hour(s)). No results found.  Review of Systems  Musculoskeletal: Positive for falls and joint pain.  All other systems reviewed and are negative.   There were no vitals taken for this visit. Physical Exam  Constitutional: He is oriented to person, place, and time. He appears well-developed and well-nourished. No distress.  HENT:  Head: Normocephalic and atraumatic.  Nose: Nose normal.  Eyes: Pupils are equal, round, and reactive to light. Conjunctivae and EOM are normal.  Neck: Normal range of motion. Neck supple.  Cardiovascular: Normal rate, regular rhythm, normal heart sounds and intact distal pulses.  Respiratory: Effort normal and breath sounds normal. No respiratory distress. He has no wheezes.  GI: Soft. Bowel sounds are normal. He exhibits no distension. There is no abdominal tenderness.  Musculoskeletal:     Right hip: He exhibits decreased range of motion, decreased strength, tenderness and bony tenderness.  Neurological: He is alert and oriented to person, place, and time.  Skin: Skin is warm and dry. No rash noted. No erythema.  Psychiatric: He has a normal mood and affect. His behavior is normal.     Assessment/Plan Nondisplaced R hip fracture  Discussed operative vs  nonoperative management of the hip fracture patient wishes to proceed with ORIF percutaneous pinning R hip.  This weill be done at least with an overnight in the hospital as soon as possible.  Patient was instructed to be nonweight bearing RLE, he refused walker but does have crutches.    Margart SicklesJoshua Griffith Santilli, PA-C 04/30/2018, 5:04 PM

## 2018-05-01 ENCOUNTER — Observation Stay (HOSPITAL_COMMUNITY)
Admission: RE | Admit: 2018-05-01 | Discharge: 2018-05-02 | Disposition: A | Discharge status: Home / Self Care | Payer: Medicare Other | Attending: Orthopedic Surgery | Admitting: Orthopedic Surgery

## 2018-05-01 ENCOUNTER — Inpatient Hospital Stay (HOSPITAL_COMMUNITY): Payer: Medicare Other | Admitting: Physician Assistant

## 2018-05-01 ENCOUNTER — Inpatient Hospital Stay (HOSPITAL_COMMUNITY): Payer: Medicare Other

## 2018-05-01 ENCOUNTER — Encounter (HOSPITAL_COMMUNITY): Payer: Self-pay | Admitting: *Deleted

## 2018-05-01 ENCOUNTER — Encounter (HOSPITAL_COMMUNITY)
Admission: RE | Disposition: A | Discharge status: Home / Self Care | Payer: Self-pay | Source: Home / Self Care | Attending: Orthopedic Surgery

## 2018-05-01 ENCOUNTER — Other Ambulatory Visit: Payer: Self-pay

## 2018-05-01 DIAGNOSIS — Z888 Allergy status to other drugs, medicaments and biological substances status: Secondary | ICD-10-CM | POA: Diagnosis not present

## 2018-05-01 DIAGNOSIS — S72001A Fracture of unspecified part of neck of right femur, initial encounter for closed fracture: Secondary | ICD-10-CM | POA: Diagnosis present

## 2018-05-01 DIAGNOSIS — Z79890 Hormone replacement therapy (postmenopausal): Secondary | ICD-10-CM | POA: Diagnosis not present

## 2018-05-01 DIAGNOSIS — S72011A Unspecified intracapsular fracture of right femur, initial encounter for closed fracture: Secondary | ICD-10-CM | POA: Diagnosis not present

## 2018-05-01 DIAGNOSIS — Z880 Allergy status to penicillin: Secondary | ICD-10-CM | POA: Insufficient documentation

## 2018-05-01 DIAGNOSIS — Z96651 Presence of right artificial knee joint: Secondary | ICD-10-CM | POA: Insufficient documentation

## 2018-05-01 DIAGNOSIS — W19XXXA Unspecified fall, initial encounter: Secondary | ICD-10-CM | POA: Diagnosis not present

## 2018-05-01 DIAGNOSIS — Z419 Encounter for procedure for purposes other than remedying health state, unspecified: Secondary | ICD-10-CM

## 2018-05-01 DIAGNOSIS — I10 Essential (primary) hypertension: Secondary | ICD-10-CM | POA: Diagnosis not present

## 2018-05-01 DIAGNOSIS — Z87891 Personal history of nicotine dependence: Secondary | ICD-10-CM | POA: Diagnosis not present

## 2018-05-01 DIAGNOSIS — Z79899 Other long term (current) drug therapy: Secondary | ICD-10-CM | POA: Diagnosis not present

## 2018-05-01 DIAGNOSIS — G4730 Sleep apnea, unspecified: Secondary | ICD-10-CM | POA: Insufficient documentation

## 2018-05-01 DIAGNOSIS — F329 Major depressive disorder, single episode, unspecified: Secondary | ICD-10-CM | POA: Diagnosis not present

## 2018-05-01 DIAGNOSIS — M1990 Unspecified osteoarthritis, unspecified site: Secondary | ICD-10-CM | POA: Diagnosis not present

## 2018-05-01 DIAGNOSIS — E039 Hypothyroidism, unspecified: Secondary | ICD-10-CM | POA: Insufficient documentation

## 2018-05-01 HISTORY — PX: HIP PINNING,CANNULATED: SHX1758

## 2018-05-01 HISTORY — DX: Adverse effect of unspecified anesthetic, initial encounter: T41.45XA

## 2018-05-01 HISTORY — DX: Other complications of anesthesia, initial encounter: T88.59XA

## 2018-05-01 LAB — CBC WITH DIFFERENTIAL/PLATELET
Abs Immature Granulocytes: 0.13 10*3/uL — ABNORMAL HIGH (ref 0.00–0.07)
Basophils Absolute: 0 10*3/uL (ref 0.0–0.1)
Basophils Relative: 0 %
Eosinophils Absolute: 0 10*3/uL (ref 0.0–0.5)
Eosinophils Relative: 0 %
HCT: 36.4 % — ABNORMAL LOW (ref 39.0–52.0)
Hemoglobin: 11.7 g/dL — ABNORMAL LOW (ref 13.0–17.0)
Immature Granulocytes: 1 %
Lymphocytes Relative: 17 %
Lymphs Abs: 3.4 10*3/uL (ref 0.7–4.0)
MCH: 30.3 pg (ref 26.0–34.0)
MCHC: 32.1 g/dL (ref 30.0–36.0)
MCV: 94.3 fL (ref 80.0–100.0)
Monocytes Absolute: 1.6 10*3/uL — ABNORMAL HIGH (ref 0.1–1.0)
Monocytes Relative: 8 %
Neutro Abs: 15.2 10*3/uL — ABNORMAL HIGH (ref 1.7–7.7)
Neutrophils Relative %: 74 %
Platelets: 270 10*3/uL (ref 150–400)
RBC: 3.86 MIL/uL — ABNORMAL LOW (ref 4.22–5.81)
RDW: 14 % (ref 11.5–15.5)
WBC: 20.4 10*3/uL — ABNORMAL HIGH (ref 4.0–10.5)
nRBC: 0 % (ref 0.0–0.2)

## 2018-05-01 LAB — TYPE AND SCREEN
ABO/RH(D): O NEG
Antibody Screen: NEGATIVE

## 2018-05-01 LAB — COMPREHENSIVE METABOLIC PANEL
ALT: 20 U/L (ref 0–44)
AST: 19 U/L (ref 15–41)
Albumin: 4.2 g/dL (ref 3.5–5.0)
Alkaline Phosphatase: 119 U/L (ref 38–126)
Anion gap: 9 (ref 5–15)
BUN: 34 mg/dL — ABNORMAL HIGH (ref 8–23)
CO2: 21 mmol/L — ABNORMAL LOW (ref 22–32)
Calcium: 9.4 mg/dL (ref 8.9–10.3)
Chloride: 110 mmol/L (ref 98–111)
Creatinine, Ser: 1.26 mg/dL — ABNORMAL HIGH (ref 0.61–1.24)
GFR calc Af Amer: >60 mL/min (ref >60)
GFR calc non Af Amer: 59 mL/min — ABNORMAL LOW (ref >60)
Glucose, Bld: 108 mg/dL — ABNORMAL HIGH (ref 70–99)
Potassium: 3.9 mmol/L (ref 3.5–5.1)
Sodium: 140 mmol/L (ref 135–145)
Total Bilirubin: 0.7 mg/dL (ref 0.3–1.2)
Total Protein: 7.5 g/dL (ref 6.5–8.1)

## 2018-05-01 LAB — PROTIME-INR
INR: 1 (ref 0.8–1.2)
Prothrombin Time: 13 seconds (ref 11.4–15.2)

## 2018-05-01 LAB — SURGICAL PCR SCREEN
MRSA, PCR: NEGATIVE
Staphylococcus aureus: NEGATIVE

## 2018-05-01 LAB — APTT: aPTT: 27 seconds (ref 24–36)

## 2018-05-01 LAB — ABO/RH: ABO/RH(D): O NEG

## 2018-05-01 SURGERY — FIXATION, FEMUR, NECK, PERCUTANEOUS, USING SCREW
Anesthesia: General | Laterality: Right

## 2018-05-01 MED ORDER — POLYETHYLENE GLYCOL 3350 17 G PO PACK: 17.0000 g | PACK | Freq: Every day | ORAL | Status: DC | PRN

## 2018-05-01 MED ORDER — ESMOLOL HCL 100 MG/10ML IV SOLN
INTRAVENOUS | Status: DC | PRN
  Administered 2018-05-01: 40 mg via INTRAVENOUS

## 2018-05-01 MED ORDER — ONDANSETRON HCL 4 MG/2ML IJ SOLN
INTRAMUSCULAR | Status: AC
  Filled 2018-05-01: qty 2

## 2018-05-01 MED ORDER — MIDAZOLAM HCL 2 MG/2ML IJ SOLN
INTRAMUSCULAR | Status: AC
  Filled 2018-05-01: qty 2

## 2018-05-01 MED ORDER — ONDANSETRON HCL 4 MG PO TABS: 4.0000 mg | ORAL_TABLET | Freq: Four times a day (QID) | ORAL | Status: DC | PRN

## 2018-05-01 MED ORDER — LIDOCAINE 2% (20 MG/ML) 5 ML SYRINGE
INTRAMUSCULAR | Status: AC
  Filled 2018-05-01: qty 5

## 2018-05-01 MED ORDER — BUPROPION HCL ER (XL) 300 MG PO TB24
300.0000 mg | ORAL_TABLET | Freq: Every day | ORAL | Status: DC
  Administered 2018-05-02: 300 mg via ORAL
  Filled 2018-05-01: qty 1

## 2018-05-01 MED ORDER — VANCOMYCIN HCL 10 G IV SOLR
1500.0000 mg | INTRAVENOUS | Status: AC
  Administered 2018-05-01 (×2): 1500 mg via INTRAVENOUS
  Filled 2018-05-01: qty 1500

## 2018-05-01 MED ORDER — POVIDONE-IODINE 10 % EX SWAB
2.0000 "application " | Freq: Once | CUTANEOUS | Status: AC
  Administered 2018-05-01: 2 via TOPICAL

## 2018-05-01 MED ORDER — PHENOL 1.4 % MT LIQD
1.0000 | OROMUCOSAL | Status: DC | PRN
  Filled 2018-05-01: qty 177

## 2018-05-01 MED ORDER — LIDOCAINE 2% (20 MG/ML) 5 ML SYRINGE
INTRAMUSCULAR | Status: DC | PRN
  Administered 2018-05-01: 20 mg via INTRAVENOUS
  Administered 2018-05-01: 60 mg via INTRAVENOUS

## 2018-05-01 MED ORDER — DOCUSATE SODIUM 100 MG PO CAPS
100.0000 mg | ORAL_CAPSULE | Freq: Two times a day (BID) | ORAL | Status: DC
  Administered 2018-05-01: 100 mg via ORAL
  Filled 2018-05-01: qty 1

## 2018-05-01 MED ORDER — 0.9 % SODIUM CHLORIDE (POUR BTL) OPTIME
TOPICAL | Status: DC | PRN
  Administered 2018-05-01: 1000 mL

## 2018-05-01 MED ORDER — ALUM & MAG HYDROXIDE-SIMETH 200-200-20 MG/5ML PO SUSP: 30.0000 mL | ORAL | Status: DC | PRN

## 2018-05-01 MED ORDER — PROPOFOL 10 MG/ML IV BOLUS
INTRAVENOUS | Status: DC | PRN
  Administered 2018-05-01: 120 mg via INTRAVENOUS

## 2018-05-01 MED ORDER — AMLODIPINE BESYLATE 10 MG PO TABS
10.0000 mg | ORAL_TABLET | Freq: Every day | ORAL | Status: DC
  Administered 2018-05-02: 10 mg via ORAL
  Filled 2018-05-01: qty 1

## 2018-05-01 MED ORDER — FENTANYL CITRATE (PF) 250 MCG/5ML IJ SOLN
INTRAMUSCULAR | Status: DC | PRN
  Administered 2018-05-01 (×5): 50 ug via INTRAVENOUS

## 2018-05-01 MED ORDER — VANCOMYCIN HCL IN DEXTROSE 1-5 GM/200ML-% IV SOLN
1000.0000 mg | Freq: Once | INTRAVENOUS | Status: AC
  Administered 2018-05-01: 1000 mg via INTRAVENOUS
  Filled 2018-05-01 (×2): qty 200

## 2018-05-01 MED ORDER — SORBITOL 70 % SOLN
30.0000 mL | Freq: Every day | Status: DC | PRN
  Filled 2018-05-01: qty 30

## 2018-05-01 MED ORDER — SUCCINYLCHOLINE CHLORIDE 200 MG/10ML IV SOSY
PREFILLED_SYRINGE | INTRAVENOUS | Status: DC | PRN
  Administered 2018-05-01: 100 mg via INTRAVENOUS

## 2018-05-01 MED ORDER — DEXAMETHASONE SODIUM PHOSPHATE 10 MG/ML IJ SOLN
INTRAMUSCULAR | Status: DC | PRN
  Administered 2018-05-01: 10 mg via INTRAVENOUS

## 2018-05-01 MED ORDER — MIDAZOLAM HCL 5 MG/5ML IJ SOLN
INTRAMUSCULAR | Status: DC | PRN
  Administered 2018-05-01: 2 mg via INTRAVENOUS

## 2018-05-01 MED ORDER — ASPIRIN 325 MG PO TABS: 325.0000 mg | ORAL_TABLET | Freq: Every day | ORAL | 0 refills | Status: AC

## 2018-05-01 MED ORDER — DEXAMETHASONE SODIUM PHOSPHATE 10 MG/ML IJ SOLN
INTRAMUSCULAR | Status: AC
  Filled 2018-05-01: qty 1

## 2018-05-01 MED ORDER — HYDROCODONE-ACETAMINOPHEN 7.5-325 MG PO TABS: 1.0000 | ORAL_TABLET | ORAL | 0 refills | Status: AC | PRN

## 2018-05-01 MED ORDER — ONDANSETRON HCL 4 MG/2ML IJ SOLN
INTRAMUSCULAR | Status: DC | PRN
  Administered 2018-05-01: 4 mg via INTRAVENOUS

## 2018-05-01 MED ORDER — ACETAMINOPHEN 500 MG PO TABS
1000.0000 mg | ORAL_TABLET | Freq: Once | ORAL | Status: AC
  Administered 2018-05-01: 1000 mg via ORAL
  Filled 2018-05-01: qty 2

## 2018-05-01 MED ORDER — SODIUM CHLORIDE 0.9 % IV SOLN
INTRAVENOUS | Status: DC
  Administered 2018-05-01: 09:00:00 via INTRAVENOUS

## 2018-05-01 MED ORDER — CHLORHEXIDINE GLUCONATE 4 % EX LIQD: 60.0000 mL | Freq: Once | CUTANEOUS | Status: DC

## 2018-05-01 MED ORDER — SODIUM CHLORIDE 0.9 % IV SOLN
INTRAVENOUS | Status: DC
  Administered 2018-05-01 – 2018-05-02 (×2): via INTRAVENOUS

## 2018-05-01 MED ORDER — FLEET ENEMA 7-19 GM/118ML RE ENEM: 1.0000 | ENEMA | Freq: Once | RECTAL | Status: DC | PRN

## 2018-05-01 MED ORDER — SUCCINYLCHOLINE CHLORIDE 200 MG/10ML IV SOSY
PREFILLED_SYRINGE | INTRAVENOUS | Status: AC
  Filled 2018-05-01: qty 10

## 2018-05-01 MED ORDER — PHENYLEPHRINE 40 MCG/ML (10ML) SYRINGE FOR IV PUSH (FOR BLOOD PRESSURE SUPPORT)
PREFILLED_SYRINGE | INTRAVENOUS | Status: DC | PRN
  Administered 2018-05-01: 80 ug via INTRAVENOUS

## 2018-05-01 MED ORDER — ROCURONIUM BROMIDE 10 MG/ML (PF) SYRINGE
PREFILLED_SYRINGE | INTRAVENOUS | Status: DC | PRN
  Administered 2018-05-01 (×2): 40 mg via INTRAVENOUS

## 2018-05-01 MED ORDER — FENTANYL CITRATE (PF) 250 MCG/5ML IJ SOLN
INTRAMUSCULAR | Status: AC
  Filled 2018-05-01: qty 5

## 2018-05-01 MED ORDER — METOCLOPRAMIDE HCL 5 MG/ML IJ SOLN: 5.0000 mg | Freq: Three times a day (TID) | INTRAMUSCULAR | Status: DC | PRN

## 2018-05-01 MED ORDER — HYDROMORPHONE HCL 2 MG/ML IJ SOLN
INTRAMUSCULAR | Status: AC
  Filled 2018-05-01: qty 1

## 2018-05-01 MED ORDER — PROPOFOL 10 MG/ML IV BOLUS
INTRAVENOUS | Status: AC
  Filled 2018-05-01: qty 20

## 2018-05-01 MED ORDER — HYDROCODONE-ACETAMINOPHEN 5-325 MG PO TABS
1.0000 | ORAL_TABLET | ORAL | Status: DC | PRN
  Administered 2018-05-01 (×2): 1 via ORAL
  Administered 2018-05-01: 2 via ORAL
  Filled 2018-05-01 (×2): qty 1
  Filled 2018-05-01: qty 2
  Filled 2018-05-01: qty 1

## 2018-05-01 MED ORDER — ADULT MULTIVITAMIN W/MINERALS CH
1.0000 | ORAL_TABLET | Freq: Every day | ORAL | Status: DC
  Administered 2018-05-01 – 2018-05-02 (×2): 1 via ORAL
  Filled 2018-05-01 (×2): qty 1

## 2018-05-01 MED ORDER — ASPIRIN EC 325 MG PO TBEC
325.0000 mg | DELAYED_RELEASE_TABLET | Freq: Every day | ORAL | Status: DC
  Administered 2018-05-02: 325 mg via ORAL
  Filled 2018-05-01: qty 1

## 2018-05-01 MED ORDER — METOCLOPRAMIDE HCL 5 MG PO TABS: 5.0000 mg | ORAL_TABLET | Freq: Three times a day (TID) | ORAL | Status: DC | PRN

## 2018-05-01 MED ORDER — VANCOMYCIN HCL 1000 MG IV SOLR
1000.0000 mg | Freq: Two times a day (BID) | INTRAVENOUS | Status: DC
  Filled 2018-05-01: qty 1000

## 2018-05-01 MED ORDER — LEVOTHYROXINE SODIUM 112 MCG PO TABS
112.0000 ug | ORAL_TABLET | Freq: Every day | ORAL | Status: DC
  Administered 2018-05-02: 112 ug via ORAL
  Filled 2018-05-01: qty 1

## 2018-05-01 MED ORDER — MENTHOL 3 MG MT LOZG: 1.0000 | LOZENGE | OROMUCOSAL | Status: DC | PRN

## 2018-05-01 MED ORDER — ONDANSETRON HCL 4 MG/2ML IJ SOLN: 4.0000 mg | Freq: Four times a day (QID) | INTRAMUSCULAR | Status: DC | PRN

## 2018-05-01 MED ORDER — LISINOPRIL 20 MG PO TABS
40.0000 mg | ORAL_TABLET | Freq: Every day | ORAL | Status: DC
  Administered 2018-05-01 – 2018-05-02 (×2): 40 mg via ORAL
  Filled 2018-05-01 (×2): qty 2

## 2018-05-01 MED ORDER — HYDROCODONE-ACETAMINOPHEN 7.5-325 MG PO TABS
1.0000 | ORAL_TABLET | ORAL | Status: DC | PRN
  Administered 2018-05-02: 1 via ORAL
  Filled 2018-05-01: qty 1

## 2018-05-01 MED ORDER — ACETAMINOPHEN 325 MG PO TABS: 650.0000 mg | ORAL_TABLET | ORAL | Status: DC | PRN

## 2018-05-01 MED ORDER — MORPHINE SULFATE (PF) 2 MG/ML IV SOLN: 0.5000 mg | INTRAVENOUS | Status: DC | PRN

## 2018-05-01 MED ORDER — ROCURONIUM BROMIDE 10 MG/ML (PF) SYRINGE
PREFILLED_SYRINGE | INTRAVENOUS | Status: AC
  Filled 2018-05-01: qty 10

## 2018-05-01 MED ORDER — HYDROMORPHONE HCL 1 MG/ML IJ SOLN
0.2500 mg | INTRAMUSCULAR | Status: DC | PRN
  Administered 2018-05-01: .4 mg via INTRAVENOUS
  Administered 2018-05-01: .6 mg via INTRAVENOUS
  Administered 2018-05-01: .4 mg via INTRAVENOUS

## 2018-05-01 SURGICAL SUPPLY — 38 items
BAG ZIPLOCK 12X15 (MISCELLANEOUS) ×3 IMPLANT
BENZOIN TINCTURE PRP APPL 2/3 (GAUZE/BANDAGES/DRESSINGS) ×2 IMPLANT
BIT DRILL 4.8X200 CANN (BIT) ×2 IMPLANT
BNDG GAUZE ELAST 4 BULKY (GAUZE/BANDAGES/DRESSINGS) ×3 IMPLANT
CHLORAPREP W/TINT 26 (MISCELLANEOUS) ×3 IMPLANT
COVER SURGICAL LIGHT HANDLE (MISCELLANEOUS) ×3 IMPLANT
COVER WAND RF STERILE (DRAPES) IMPLANT
DRAPE STERI IOBAN 125X83 (DRAPES) ×3 IMPLANT
DRESSING AQUACEL AG SP 3.5X6 (GAUZE/BANDAGES/DRESSINGS) IMPLANT
DRSG AQUACEL AG ADV 3.5X 6 (GAUZE/BANDAGES/DRESSINGS) ×2 IMPLANT
DRSG AQUACEL AG SP 3.5X6 (GAUZE/BANDAGES/DRESSINGS) ×3
DRSG EMULSION OIL 3X16 NADH (GAUZE/BANDAGES/DRESSINGS) ×3 IMPLANT
DRSG TEGADERM 8X12 (GAUZE/BANDAGES/DRESSINGS) ×4 IMPLANT
ELECT REM PT RETURN 15FT ADLT (MISCELLANEOUS) ×3 IMPLANT
GAUZE SPONGE 4X4 12PLY STRL (GAUZE/BANDAGES/DRESSINGS) ×3 IMPLANT
GLOVE BIO SURGEON STRL SZ7.5 (GLOVE) ×3 IMPLANT
GLOVE SURG ORTHO 8.0 STRL STRW (GLOVE) ×3 IMPLANT
GOWN STRL REUS W/TWL XL LVL3 (GOWN DISPOSABLE) ×6 IMPLANT
KIT BASIN OR (CUSTOM PROCEDURE TRAY) ×3 IMPLANT
KIT TURNOVER KIT A (KITS) IMPLANT
MANIFOLD NEPTUNE II (INSTRUMENTS) ×3 IMPLANT
NS IRRIG 1000ML POUR BTL (IV SOLUTION) ×3 IMPLANT
PACK GENERAL/GYN (CUSTOM PROCEDURE TRAY) ×3 IMPLANT
PAD CAST 4YDX4 CTTN HI CHSV (CAST SUPPLIES) ×1 IMPLANT
PADDING CAST COTTON 4X4 STRL (CAST SUPPLIES) ×2
PIN GUIDE DRILL TIP 2.8X300 (DRILL) ×6 IMPLANT
PROTECTOR NERVE ULNAR (MISCELLANEOUS) ×3 IMPLANT
SCREW CANN 8.0X100 HIP (Screw) ×2 IMPLANT
SCREW CANN 8.0X105 HIP (Screw) ×4 IMPLANT
SPONGE LAP 18X18 RF (DISPOSABLE) IMPLANT
STAPLER VISISTAT 35W (STAPLE) ×3 IMPLANT
SUT MNCRL AB 3-0 PS2 18 (SUTURE) ×2 IMPLANT
SUT VIC AB 1-0 CT2 27 (SUTURE) ×6 IMPLANT
SUT VIC AB 2-0 CT2 27 (SUTURE) ×6 IMPLANT
TAPE STRIPS DRAPE STRL (GAUZE/BANDAGES/DRESSINGS) ×2 IMPLANT
TOWEL OR 17X26 10 PK STRL BLUE (TOWEL DISPOSABLE) ×6 IMPLANT
TRAY FOLEY MTR SLVR 16FR STAT (SET/KITS/TRAYS/PACK) IMPLANT
WATER STERILE IRR 1000ML POUR (IV SOLUTION) ×3 IMPLANT

## 2018-05-01 NOTE — Anesthesia Procedure Notes (Signed)
Procedure Name: Intubation Date/Time: 05/01/2018 10:48 AM Performed by: Silas Sacramento, CRNA Pre-anesthesia Checklist: Patient identified, Emergency Drugs available, Suction available and Patient being monitored Patient Re-evaluated:Patient Re-evaluated prior to induction Oxygen Delivery Method: Circle system utilized Preoxygenation: Pre-oxygenation with 100% oxygen Induction Type: IV induction Ventilation: Mask ventilation without difficulty Laryngoscope Size: Mac and 4 Grade View: Grade I Tube type: Oral Tube size: 7.5 mm Number of attempts: 1 Airway Equipment and Method: Stylet Placement Confirmation: ETT inserted through vocal cords under direct vision,  positive ETCO2 and breath sounds checked- equal and bilateral Secured at: 23 cm Tube secured with: Tape Dental Injury: Teeth and Oropharynx as per pre-operative assessment

## 2018-05-01 NOTE — Discharge Instructions (Signed)
Diet: As you were doing prior to hospitalization   Activity: Increase activity slowly as tolerated  No lifting or driving for 6 weeks   Shower: May shower with a dressing on post op day #2, NO SOAKING in tub   Dressing: You may change your dressing on post op day #7. You may shower over initial dressing. Then change the dressing daily with sterile 4"x4"s gauze dressing   Weight Bearing: 50% weight bearing right leg as taught in physical therapy. Use a walker or  Crutches as instructed.   To prevent constipation: you may use a stool softener such as -  Colace ( over the counter) 100 mg by mouth twice a day  Drink plenty of fluids ( prune juice may be helpful) and high fiber foods  Miralax ( over the counter) for constipation as needed.   Precautions: If you experience chest pain or shortness of breath - call 911 immediately For transfer to the hospital emergency department!!  If you develop a fever greater that 101 F, purulent drainage from wound, increased redness or drainage from wound, or calf pain -- Call the office   Follow- Up Appointment: Please call for an appointment to be seen in 2 weeks  Mariano Colan - [phone]

## 2018-05-01 NOTE — Brief Op Note (Signed)
05/01/2018  12:17 PM  PATIENT:  Christian Snyder  68 y.o. male  PRE-OPERATIVE DIAGNOSIS:  RIGHT HIP FRACTURE  POST-OPERATIVE DIAGNOSIS:  RIGHT HIP FRACTURE  PROCEDURE:  Procedure(s): PERCUTANEOUS HIP PINNING (Right)  SURGEON:  Surgeon(s) and Role:    * Frederico Hamman, MD - Primary  PHYSICIAN ASSISTANT: Margart Sickles, PA-C  ASSISTANTS:    ANESTHESIA:   general  EBL:  50 mL   BLOOD ADMINISTERED:none  DRAINS: none   LOCAL MEDICATIONS USED:  NONE  SPECIMEN:  No Specimen  DISPOSITION OF SPECIMEN:  N/A  COUNTS:  YES  TOURNIQUET:  * No tourniquets in log *  DICTATION: .Other Dictation: Dictation Number unknown  PLAN OF CARE: Admit for overnight observation  PATIENT DISPOSITION:  PACU - hemodynamically stable.   Delay start of Pharmacological VTE agent (>24hrs) due to surgical blood loss or risk of bleeding: yes

## 2018-05-01 NOTE — Op Note (Signed)
NAMEDEX, GANSCHOW MEDICAL RECORD BO:[id number] ACCOUNT [id number] DATE OF BIRTH:1950-08-19 FACILITY: [facility] LOCATION: [facility] PHYSICIAN:W. Genia Perin JR., MD  OPERATIVE REPORT  DATE OF PROCEDURE:  05/01/2018  PREOPERATIVE DIAGNOSIS:  Nondisplaced right femoral neck fracture.  OPERATION:  Percutaneous pinning, right femoral neck fracture (Biomet 8 cancellous screws x3).  SURGEON:  Marcie Mowers, MD  ASSISTANTVincent Peyer.  ESTIMATED BLOOD LOSS:  Minimal.  DESCRIPTION OF PROCEDURE:  The patient was placed on the Hana table with an incision made below the greater trochanter, splitting of the iliotibial band and vastus lateralis.  We inserted the guide pins in the inferior aspect of the neck,  confirmed  placement in good position in the AP and lateral planes.  We then placed 2 other guide pins in an inverted triangle cluster, confirmed positioning and lengths via measurement off the guide pin.  We then placed three 8.0 screws in good position, confirmed  to have good purchase and good position in the AP and lateral planes.  The wound was irrigated.  Closure was affected with 0 Vicryl, 2-0 Vicryl.  The skin was closed as well.    Taken to recovery room in stable condition.  AN/NUANCE  D:05/01/2018 T:05/01/2018 JOB:[id number]

## 2018-05-01 NOTE — Interval H&P Note (Signed)
History and Physical Interval Note:  05/01/2018 10:21 AM  Christian Snyder  has presented today for surgery, with the diagnosis of RIGHT HIP FRACTURE.  The various methods of treatment have been discussed with the patient and family. After consideration of risks, benefits and other options for treatment, the patient has consented to  Procedure(s): PERCUTANEOUS HIP PINNING (Right) as a surgical intervention.  The patient's history has been reviewed, patient examined, no change in status, stable for surgery.  I have reviewed the patient's chart and labs.  Questions were answered to the patient's satisfaction.     Thera Flake

## 2018-05-01 NOTE — Anesthesia Postprocedure Evaluation (Signed)
Anesthesia Post Note  Patient: Carvin Wildasin  Procedure(s) Performed: PERCUTANEOUS HIP PINNING (Right )     Patient location during evaluation: PACU Anesthesia Type: General Level of consciousness: awake and alert Pain management: pain level controlled Vital Signs Assessment: post-procedure vital signs reviewed and stable Respiratory status: spontaneous breathing, nonlabored ventilation, respiratory function stable and patient connected to nasal cannula oxygen Cardiovascular status: blood pressure returned to baseline and stable Postop Assessment: no apparent nausea or vomiting Anesthetic complications: no    Last Vitals:  Vitals:   05/01/18 1222 05/01/18 1230  BP: 135/82 (!) 146/91  Pulse: 64 64  Resp: 15 15  Temp: 36.6 C   SpO2: 100% 100%    Last Pain:  Vitals:   05/01/18 1222  PainSc: 3                  Tayelor Osborne,W. EDMOND

## 2018-05-01 NOTE — Transfer of Care (Signed)
Immediate Anesthesia Transfer of Care Note  Patient: Christian Snyder  Procedure(s) Performed: PERCUTANEOUS HIP PINNING (Right )  Patient Location: PACU  Anesthesia Type:General  Level of Consciousness: awake, oriented, sedated and patient cooperative  Airway & Oxygen Therapy: Patient Spontanous Breathing and Patient connected to face mask  Post-op Assessment: Report given to RN, Post -op Vital signs reviewed and stable and Patient moving all extremities X 4  Post vital signs: Reviewed and stable  Last Vitals:  Vitals Value Taken Time  BP    Temp    Pulse    Resp    SpO2      Last Pain:  Vitals:   05/01/18 0842  PainSc: 1       Patients Stated Pain Goal: 1 (05/01/18 0842)  Complications: No apparent anesthesia complications

## 2018-05-02 DIAGNOSIS — Z87891 Personal history of nicotine dependence: Secondary | ICD-10-CM | POA: Diagnosis not present

## 2018-05-02 DIAGNOSIS — I10 Essential (primary) hypertension: Secondary | ICD-10-CM | POA: Diagnosis not present

## 2018-05-02 DIAGNOSIS — Z880 Allergy status to penicillin: Secondary | ICD-10-CM | POA: Diagnosis not present

## 2018-05-02 DIAGNOSIS — Z96651 Presence of right artificial knee joint: Secondary | ICD-10-CM | POA: Diagnosis not present

## 2018-05-02 DIAGNOSIS — S72011A Unspecified intracapsular fracture of right femur, initial encounter for closed fracture: Secondary | ICD-10-CM | POA: Diagnosis not present

## 2018-05-02 DIAGNOSIS — Z888 Allergy status to other drugs, medicaments and biological substances status: Secondary | ICD-10-CM | POA: Diagnosis not present

## 2018-05-02 LAB — BASIC METABOLIC PANEL
Anion gap: 11 (ref 5–15)
BUN: 24 mg/dL — ABNORMAL HIGH (ref 8–23)
CO2: 19 mmol/L — ABNORMAL LOW (ref 22–32)
Calcium: 8.3 mg/dL — ABNORMAL LOW (ref 8.9–10.3)
Chloride: 107 mmol/L (ref 98–111)
Creatinine, Ser: 1.18 mg/dL (ref 0.61–1.24)
GFR calc Af Amer: >60 mL/min (ref >60)
GFR calc non Af Amer: >60 mL/min (ref >60)
Glucose, Bld: 133 mg/dL — ABNORMAL HIGH (ref 70–99)
Potassium: 4.1 mmol/L (ref 3.5–5.1)
Sodium: 137 mmol/L (ref 135–145)

## 2018-05-02 LAB — CBC
HCT: 33.8 % — ABNORMAL LOW (ref 39.0–52.0)
Hemoglobin: 10.4 g/dL — ABNORMAL LOW (ref 13.0–17.0)
MCH: 29.9 pg (ref 26.0–34.0)
MCHC: 30.8 g/dL (ref 30.0–36.0)
MCV: 97.1 fL (ref 80.0–100.0)
Platelets: 228 10*3/uL (ref 150–400)
RBC: 3.48 MIL/uL — ABNORMAL LOW (ref 4.22–5.81)
RDW: 14 % (ref 11.5–15.5)
WBC: 17.2 10*3/uL — ABNORMAL HIGH (ref 4.0–10.5)
nRBC: 0 % (ref 0.0–0.2)

## 2018-05-02 NOTE — Care Management Obs Status (Signed)
MEDICARE OBSERVATION STATUS NOTIFICATION   Patient Details  Name: Christian Snyder MRN: [id number] Date of Birth: August 15, 1950   Medicare Observation Status Notification Given:  Yes    Elliot Cousin, RN 05/02/2018, 10:13 AM

## 2018-05-02 NOTE — Plan of Care (Signed)
  Problem: Education: Goal: Knowledge of General Education information will improve Description Including pain rating scale, medication(s)/side effects and non-pharmacologic comfort measures 05/02/2018 1126 by Suann Larry, RN Outcome: Adequate for Discharge 05/02/2018 0840 by Suann Larry, RN Outcome: Progressing   Problem: Health Behavior/Discharge Planning: Goal: Ability to manage health-related needs will improve 05/02/2018 1126 by Suann Larry, RN Outcome: Adequate for Discharge 05/02/2018 0840 by Suann Larry, RN Outcome: Progressing   Problem: Clinical Measurements: Goal: Ability to maintain clinical measurements within normal limits will improve 05/02/2018 1126 by Suann Larry, RN Outcome: Adequate for Discharge 05/02/2018 0840 by Suann Larry, RN Outcome: Progressing Goal: Will remain free from infection 05/02/2018 1126 by Suann Larry, RN Outcome: Adequate for Discharge 05/02/2018 0840 by Suann Larry, RN Outcome: Progressing Goal: Diagnostic test results will improve 05/02/2018 1126 by Suann Larry, RN Outcome: Adequate for Discharge 05/02/2018 0840 by Suann Larry, RN Outcome: Progressing Goal: Respiratory complications will improve 05/02/2018 1126 by Suann Larry, RN Outcome: Adequate for Discharge 05/02/2018 0840 by Suann Larry, RN Outcome: Progressing Goal: Cardiovascular complication will be avoided 05/02/2018 1126 by Suann Larry, RN Outcome: Adequate for Discharge 05/02/2018 0840 by Suann Larry, RN Outcome: Progressing   Problem: Activity: Goal: Risk for activity intolerance will decrease 05/02/2018 1126 by Suann Larry, RN Outcome: Adequate for Discharge 05/02/2018 0840 by Suann Larry, RN Outcome: Progressing   Problem: Nutrition: Goal: Adequate nutrition will be maintained 05/02/2018 1126 by Suann Larry, RN Outcome: Adequate for Discharge 05/02/2018 0840 by Suann Larry, RN Outcome:  Progressing   Problem: Coping: Goal: Level of anxiety will decrease 05/02/2018 1126 by Suann Larry, RN Outcome: Adequate for Discharge 05/02/2018 0840 by Suann Larry, RN Outcome: Progressing   Problem: Elimination: Goal: Will not experience complications related to bowel motility 05/02/2018 1126 by Suann Larry, RN Outcome: Adequate for Discharge 05/02/2018 0840 by Suann Larry, RN Outcome: Progressing Goal: Will not experience complications related to urinary retention 05/02/2018 1126 by Suann Larry, RN Outcome: Adequate for Discharge 05/02/2018 0840 by Suann Larry, RN Outcome: Progressing   Problem: Pain Managment: Goal: General experience of comfort will improve 05/02/2018 1126 by Suann Larry, RN Outcome: Adequate for Discharge 05/02/2018 0840 by Suann Larry, RN Outcome: Progressing   Problem: Safety: Goal: Ability to remain free from injury will improve 05/02/2018 1126 by Suann Larry, RN Outcome: Adequate for Discharge 05/02/2018 0840 by Suann Larry, RN Outcome: Progressing   Problem: Skin Integrity: Goal: Risk for impaired skin integrity will decrease 05/02/2018 1126 by Suann Larry, RN Outcome: Adequate for Discharge 05/02/2018 0840 by Suann Larry, RN Outcome: Progressing  Home with wife. Discharge teaching done, written information given. Pt has all his belongings.

## 2018-05-02 NOTE — Discharge Summary (Signed)
Patient ID: Christian Snyder MRN: [id number] DOB/AGE: 29-May-1950 68 y.o.  Admit date: 05/01/2018 Discharge date: 05/02/2018  Admission Diagnoses:  Active Problems:   Closed right hip fracture [facility])   Discharge Diagnoses:  Same  Past Medical History:  Diagnosis Date  . Anxiety   . Complication of anesthesia    patient states cold after anesthesia   . Depression   . Early cataract   . Essential hypertension   . Hypothyroidism   . Primary localized osteoarthritis of right knee   . Primary localized osteoarthritis of right knee   . Right inguinal hernia   . Sleep apnea    [facility] dr. [name] wears CPAP at night  . Thyroid disease   . Wears glasses     Surgeries: Procedure(s): PERCUTANEOUS HIP PINNING on 05/01/2018   Consultants:   Discharged Condition: Improved  Hospital Course: Christian Snyder is an 68 y.o. male who was admitted 05/01/2018 for operative treatment of<principal problem not specified>. Patient has severe unremitting pain that affects sleep, daily activities, and work/hobbies. After pre-op clearance the patient was taken to the operating room on 05/01/2018 and underwent  Procedure(s): PERCUTANEOUS HIP PINNING.    Patient was given perioperative antibiotics:  Anti-infectives (From admission, onward)   Start     Dose/Rate Route Frequency Ordered Stop   05/01/18 2200  vancomycin (VANCOCIN) 1,000 mg in sodium chloride 0.9 % 250 mL IVPB  Status:  Discontinued     1,000 mg 250 mL/hr over 60 Minutes Intravenous Every 12 hours 05/01/18 1339 05/01/18 1347   05/01/18 2200  vancomycin (VANCOCIN) IVPB 1000 mg/200 mL premix     1,000 mg 200 mL/hr over 60 Minutes Intravenous  Once 05/01/18 1347 05/01/18 2333   05/01/18 0715  vancomycin (VANCOCIN) 1,500 mg in sodium chloride 0.9 % 500 mL IVPB     1,500 mg 250 mL/hr over 120 Minutes Intravenous On call to O.R. 05/01/18 0706 05/01/18 1418       Patient was given sequential compression devices, early ambulation, and  chemoprophylaxis to prevent DVT.  Patient benefited maximally from hospital stay and there were no complications.    Recent vital signs:  Patient Vitals for the past 24 hrs:  BP Temp Temp src Pulse Resp SpO2  05/02/18 0519 (!) 146/78 98.1 F (36.7 C) Oral 63 17 97 %  05/02/18 0223 125/70 - - 67 14 96 %  05/01/18 2052 126/74 98.3 F (36.8 C) Oral 66 15 96 %  05/01/18 1807 (!) 146/78 - - 93 18 95 %  05/01/18 1646 (!) 152/82 97.6 F (36.4 C) - 69 16 96 %  05/01/18 1540 (!) 144/75 97.9 F (36.6 C) Oral 69 20 100 %  05/01/18 1452 (!) 152/76 97.6 F (36.4 C) - 66 16 96 %  05/01/18 1336 (!) 149/83 98.2 F (36.8 C) Oral 81 18 97 %  05/01/18 1315 (!) 145/88 (!) 97.5 F (36.4 C) - 85 15 100 %  05/01/18 1300 (!) 142/82 97.8 F (36.6 C) - 65 11 100 %  05/01/18 1245 140/90 - - - - -  05/01/18 1230 (!) 146/91 - - 64 15 100 %  05/01/18 1222 135/82 97.8 F (36.6 C) - 64 15 100 %     Recent laboratory studies:  Recent Labs    05/01/18 0850 05/02/18 0234  WBC 20.4* 17.2*  HGB 11.7* 10.4*  HCT 36.4* 33.8*  PLT 270 228  NA 140 137  K 3.9 4.1  CL 110 107  CO2 21*  19*  BUN 34* 24*  CREATININE 1.26* 1.18  GLUCOSE 108* 133*  INR 1.0  --   CALCIUM 9.4 8.3*     Discharge Medications:   Allergies as of 05/02/2018      Reactions   Celexa [citalopram]    Sleepiness, forgetfullness   Penicillins Hives   Has patient had a PCN reaction causing immediate rash, facial/tongue/throat swelling, SOB or lightheadedness with hypotension: Unknown Has patient had a PCN reaction causing severe rash involving mucus membranes or skin necrosis: Unknown Has patient had a PCN reaction that required hospitalization: Unknown Has patient had a PCN reaction occurring within the last 10 years: No If all of the above answers are "NO", then may proceed with Cephalosporin use.   Pravastatin    Fatigue, muscle aches      Medication List    STOP taking these medications   ibuprofen 200 MG tablet Commonly  known as:  ADVIL,MOTRIN     TAKE these medications   acetaminophen 325 MG tablet Commonly known as:  TYLENOL Take 2 tablets (650 mg total) every 4 (four) hours as needed by mouth for mild pain ((score 1 to 3) or temp > 100.5).   amLODipine 5 MG tablet Commonly known as:  NORVASC Take 10 mg by mouth daily.   aspirin 325 MG tablet Take 1 tablet (325 mg total) by mouth daily. Take daily for 30 days post surgery for dvt prophylaxis   buPROPion 300 MG 24 hr tablet Commonly known as:  WELLBUTRIN XL Take 300 mg by mouth daily.   Fish Oil 1000 MG Caps Take 1,000 mg by mouth daily.   HYDROcodone-acetaminophen 7.5-325 MG tablet Commonly known as:  NORCO Take 1 tablet by mouth every 4 (four) hours as needed for moderate pain or severe pain (may need 1-2 first few days following surgery).   levothyroxine 112 MCG tablet Commonly known as:  SYNTHROID, LEVOTHROID Take 112 mcg by mouth daily before breakfast.   lisinopril 40 MG tablet Commonly known as:  PRINIVIL,ZESTRIL Take 40 mg by mouth daily.   multivitamin with minerals Tabs tablet Take 1 tablet by mouth daily.   Red Yeast Rice 600 MG Caps Take 600 mg by mouth every morning.   vitamin E 400 UNIT capsule Take 400 Units by mouth daily.            Durable Medical Equipment  (From admission, onward)         Start     Ordered   05/02/18 1004  For home use only DME Walker rolling  Once    Question:  Patient needs a walker to treat with the following condition  Answer:  Hip fracture (HCC)   05/02/18 1004   05/02/18 0950  For home use only DME 3 n 1  Once     05/02/18 0950   05/02/18 0939  For home use only DME Walker rolling  Once    Question:  Patient needs a walker to treat with the following condition  Answer:  Displaced fracture of right femoral neck (HCC)   05/02/18 0940          Diagnostic Studies: Dg C-arm 1-60 Min-no Report  Result Date: 05/01/2018 Fluoroscopy was utilized by the requesting physician.  No  radiographic interpretation.   Dg Hip Operative Unilat With Pelvis Right  Result Date: 05/01/2018 CLINICAL DATA:  Right hip pinning. EXAM: OPERATIVE RIGHT HIP (WITH PELVIS IF PERFORMED) 10 VIEWS TECHNIQUE: Fluoroscopic spot image(s) were submitted for interpretation post-operatively. COMPARISON:  MRI 04/29/2018.  Plain films of 04/15/2018. FINDINGS: Multiple intraoperative views demonstrate placement of femoral neck fixation screws. Alignment is anatomic, and no acute hardware complication is identified. IMPRESSION: Intraoperative imaging. Electronically Signed   By: Jeronimo GreavesKyle  Talbot M.D.   On: 05/01/2018 12:11    Disposition: Discharge disposition: 01-Home or Self Care         Follow-up Information    Frederico Hammanaffrey, Daniel, MD. Schedule an appointment as soon as possible for a visit on 05/14/2018.   Specialty:  Orthopedic Surgery Why:  9:45 am Contact information: [address] [place] [address] [phone]            Signed: Pascal LuxKirstin J Charese Abundis 05/02/2018, 10:34 AM

## 2018-05-02 NOTE — TOC Initial Note (Signed)
Transition of Care [facility]) - Initial/Assessment Note    Patient Details  Name: Christian Snyder MRN: [id number] Date of Birth: June 25, 1950  Transition of Care [facility]) CM/SW Contact:    Christian Cousin, RN Phone Number: 05/02/2018, 10:18 AM  Clinical Narrative:                 Spoke to pt and he declined HHPT to protect him and his wife from outside visitors during COVID 31. Contacted [employer] for RW for home to be delivered to room prior to dc. Pt states his son is a Adult nurse and can work with him if needed.   Expected Discharge Plan: Home/Self Care Barriers to Discharge: No Barriers Identified   Patient Goals and CMS Choice Patient states their goals for this hospitalization and ongoing recovery are:: will complete exercises at home      Expected Discharge Plan and Services Expected Discharge Plan: Home/Self Care   Discharge Planning Services: CM Consult Post Acute Care Choice: Durable Medical Equipment Living arrangements for the past 2 months: Single Family Home Expected Discharge Date: 05/02/18               DME Arranged: Dan Humphreys rolling DME Agency: [employer] HH Arranged: Refused HH HH Agency: NA  Prior Living Arrangements/Services Living arrangements for the past 2 months: Single Family Home Lives with:: Adult Children Patient language and need for interpreter reviewed:: Yes Do you feel safe going back to the place where you live?: Yes      Need for Family Participation in Patient Care: Yes (Comment) Care giver support system in place?: Yes (comment)   Criminal Activity/Legal Involvement Pertinent to Current Situation/Hospitalization: No - Comment as needed  Activities of Daily Living Home Assistive Devices/Equipment: CPAP, Eyeglasses, Cane (specify quad or straight) ADL Screening (condition at time of admission) Patient's cognitive ability adequate to safely complete daily activities?: Yes Is the patient deaf or have difficulty hearing?: No Does the  patient have difficulty seeing, even when wearing glasses/contacts?: No Does the patient have difficulty concentrating, remembering, or making decisions?: No Patient able to express need for assistance with ADLs?: Yes Does the patient have difficulty dressing or bathing?: No Independently performs ADLs?: Yes (appropriate for developmental age) Does the patient have difficulty walking or climbing stairs?: Yes Weakness of Legs: Right Weakness of Arms/Hands: None  Permission Sought/Granted Permission sought to share information with : Case Manager, PCP, Other (comment) Permission granted to share information with : Yes, Verbal Permission Granted  Share Information with NAME: Christian Snyder  Permission granted to share info w AGENCY: [employer] [facility]  Permission granted to share info w Relationship: wife, son     Emotional Assessment       Orientation: : Oriented to Self, Oriented to Place, Oriented to Situation, Oriented to  Time   Psych Involvement: No (comment)  Admission diagnosis:  Closed right hip fracture (HCC) [S72.001A] Patient Active Problem List   Diagnosis Date Noted  . Closed right hip fracture (HCC) 05/01/2018  . Essential hypertension   . Thyroid disease   . Depression   . Anxiety   . Primary localized osteoarthritis of right knee    PCP:  Catha Gosselin, MD Pharmacy:   [facility] [place], [place] - [address] [place] [address] Phone: [phone] Fax: [phone]     Social Determinants of Health (SDOH) Interventions    Readmission Risk Interventions No flowsheet data found.

## 2018-05-02 NOTE — Progress Notes (Signed)
Physical Therapy Treatment Patient Details Name: Christian Snyder MRN: [id number] DOB: [birth year] Today's Date: 05/02/2018    History of Present Illness Pt s/p ORIF for R hip fx.  Pt with hx of R TKR    PT Comments    Pt admitted as above and presenting with functional mobility limitations 2* decreased R LE strength/ROM, post op pain and PWB status.  Pt currently mobilizing at sup level and eager for dc  Home.   Follow Up Recommendations  Follow surgeon's recommendation for DC plan and follow-up therapies     Equipment Recommendations  Rolling walker with 5" wheels    Recommendations for Other Services       Precautions / Restrictions Precautions Precautions: Fall Restrictions Weight Bearing Restrictions: Yes RLE Weight Bearing: Partial weight bearing RLE Partial Weight Bearing Percentage or Pounds: 50    Mobility  Bed Mobility Overal bed mobility: Needs Assistance Bed Mobility: Supine to Sit     Supine to sit: Supervision     General bed mobility comments: Pt self assisting R LE with UEs  Transfers Overall transfer level: Needs assistance Equipment used: Rolling walker (2 wheeled) Transfers: Sit to/from Stand Sit to Stand: Min guard         General transfer comment: cues for LE management and use of UEs to self assist  Ambulation/Gait Ambulation/Gait assistance: Min guard;Supervision Gait Distance (Feet): 111 Feet Assistive device: Rolling walker (2 wheeled) Gait Pattern/deviations: Step-to pattern;Shuffle Gait velocity: decr   General Gait Details: cues for sequence, posture, PWB and position from [employer]   Stairs             Wheelchair Mobility    Modified Rankin (Stroke Patients Only)       Balance Overall balance assessment: Mild deficits observed, not formally tested                                          Cognition Arousal/Alertness: Awake/alert Behavior During Therapy: WFL for tasks assessed/performed Overall  Cognitive Status: Within Functional Limits for tasks assessed                                        Exercises General Exercises - Lower Extremity Ankle Circles/Pumps: AROM;Both;20 reps;Supine Quad Sets: AROM;Both;10 reps;Supine Long Arc Quad: AAROM;AROM;Right;10 reps;Supine Heel Slides: AAROM;Right;15 reps;Supine Hip ABduction/ADduction: AAROM;Right;15 reps;Supine    General Comments        Pertinent Vitals/Pain Pain Assessment: 0-10 Pain Score: 6  Pain Location: R hip Pain Descriptors / Indicators: Aching;Sore Pain Intervention(s): Limited activity within patient's tolerance;Monitored during session;Premedicated before session;Ice applied    Home Living Family/patient expects to be discharged to:: Private residence Living Arrangements: Spouse/significant other Available Help at Discharge: Family Type of Home: House Home Access: Stairs to enter Entrance Stairs-Rails: Right Home Layout: Two level Home Equipment: Crutches;Cane - single point      Prior Function Level of Independence: Independent          PT Goals (current goals can now be found in the care plan section) Acute Rehab PT Goals Patient Stated Goal: HOME PT Goal Formulation: All assessment and education complete, DC therapy    Frequency    Min 1X/week      PT Plan      Co-evaluation  AM-PAC PT "6 Clicks" Mobility   Outcome Measure  Help needed turning from your back to your side while in a flat bed without using bedrails?: None Help needed moving from lying on your back to sitting on the side of a flat bed without using bedrails?: None Help needed moving to and from a bed to a chair (including a wheelchair)?: A Little Help needed standing up from a chair using your arms (e.g., wheelchair or bedside chair)?: A Little Help needed to walk in hospital room?: A Little Help needed climbing 3-5 steps with a railing? : A Little 6 Click Score: 20    End of Session  Equipment Utilized During Treatment: Gait belt Activity Tolerance: Patient tolerated treatment well Patient left: in chair;with call bell/phone within reach Nurse Communication: Mobility status PT Visit Diagnosis: Difficulty in walking, not elsewhere classified (R26.2)     Time: 6553-7482 PT Time Calculation (min) (ACUTE ONLY): 40 min  Charges:  $Gait Training: 8-22 mins $Therapeutic Exercise: 8-22 mins                     Christian Snyder PT Acute Rehabilitation Services Pager [phone] Office [phone]    Christian Snyder 05/02/2018, 12:42 PM

## 2018-05-02 NOTE — Plan of Care (Signed)
  Problem: Education: Goal: Knowledge of General Education information will improve Description Including pain rating scale, medication(s)/side effects and non-pharmacologic comfort measures Outcome: Progressing   Problem: Health Behavior/Discharge Planning: Goal: Ability to manage health-related needs will improve Outcome: Progressing   Problem: Clinical Measurements: Goal: Ability to maintain clinical measurements within normal limits will improve Outcome: Progressing Goal: Will remain free from infection Outcome: Progressing Goal: Diagnostic test results will improve Outcome: Progressing Goal: Respiratory complications will improve Outcome: Progressing Goal: Cardiovascular complication will be avoided Outcome: Progressing   Problem: Activity: Goal: Risk for activity intolerance will decrease Outcome: Progressing   Problem: Nutrition: Goal: Adequate nutrition will be maintained Outcome: Progressing   Problem: Coping: Goal: Level of anxiety will decrease Outcome: Progressing   Problem: Elimination: Goal: Will not experience complications related to bowel motility Outcome: Progressing Goal: Will not experience complications related to urinary retention Outcome: Progressing   Problem: Pain Managment: Goal: General experience of comfort will improve Outcome: Progressing   Problem: Safety: Goal: Ability to remain free from injury will improve Outcome: Progressing   Problem: Skin Integrity: Goal: Risk for impaired skin integrity will decrease Outcome: Progressing Care plan discussed with patient.

## 2018-05-04 ENCOUNTER — Encounter (HOSPITAL_COMMUNITY): Payer: Self-pay | Admitting: Orthopedic Surgery

## 2018-05-14 DIAGNOSIS — M25551 Pain in right hip: Secondary | ICD-10-CM | POA: Diagnosis not present

## 2018-06-16 DIAGNOSIS — M25551 Pain in right hip: Secondary | ICD-10-CM | POA: Diagnosis not present

## 2018-07-02 DIAGNOSIS — E7800 Pure hypercholesterolemia, unspecified: Secondary | ICD-10-CM | POA: Diagnosis not present

## 2018-07-02 DIAGNOSIS — L570 Actinic keratosis: Secondary | ICD-10-CM | POA: Diagnosis not present

## 2018-07-02 DIAGNOSIS — Z125 Encounter for screening for malignant neoplasm of prostate: Secondary | ICD-10-CM | POA: Diagnosis not present

## 2018-07-02 DIAGNOSIS — R7301 Impaired fasting glucose: Secondary | ICD-10-CM | POA: Diagnosis not present

## 2018-07-02 DIAGNOSIS — R8290 Unspecified abnormal findings in urine: Secondary | ICD-10-CM | POA: Diagnosis not present

## 2018-07-02 DIAGNOSIS — E039 Hypothyroidism, unspecified: Secondary | ICD-10-CM | POA: Diagnosis not present

## 2018-07-02 DIAGNOSIS — F17200 Nicotine dependence, unspecified, uncomplicated: Secondary | ICD-10-CM | POA: Diagnosis not present

## 2018-07-02 DIAGNOSIS — I10 Essential (primary) hypertension: Secondary | ICD-10-CM | POA: Diagnosis not present

## 2018-07-02 DIAGNOSIS — Z1159 Encounter for screening for other viral diseases: Secondary | ICD-10-CM | POA: Diagnosis not present

## 2018-07-02 DIAGNOSIS — G4733 Obstructive sleep apnea (adult) (pediatric): Secondary | ICD-10-CM | POA: Diagnosis not present

## 2018-07-02 DIAGNOSIS — Z0000 Encounter for general adult medical examination without abnormal findings: Secondary | ICD-10-CM | POA: Diagnosis not present

## 2018-07-02 DIAGNOSIS — Z86010 Personal history of colonic polyps: Secondary | ICD-10-CM | POA: Diagnosis not present

## 2018-07-02 DIAGNOSIS — F338 Other recurrent depressive disorders: Secondary | ICD-10-CM | POA: Diagnosis not present

## 2018-07-07 ENCOUNTER — Other Ambulatory Visit: Payer: Self-pay | Admitting: Family Medicine

## 2018-07-07 DIAGNOSIS — F17209 Nicotine dependence, unspecified, with unspecified nicotine-induced disorders: Secondary | ICD-10-CM

## 2018-07-16 DIAGNOSIS — D649 Anemia, unspecified: Secondary | ICD-10-CM | POA: Diagnosis not present

## 2018-07-17 ENCOUNTER — Ambulatory Visit
Admission: RE | Admit: 2018-07-17 | Discharge: 2018-07-17 | Disposition: A | Discharge status: Home / Self Care | Payer: Medicare Other | Source: Ambulatory Visit | Attending: Family Medicine | Admitting: Family Medicine

## 2018-07-17 DIAGNOSIS — I77811 Abdominal aortic ectasia: Secondary | ICD-10-CM | POA: Diagnosis not present

## 2018-07-17 DIAGNOSIS — F17209 Nicotine dependence, unspecified, with unspecified nicotine-induced disorders: Secondary | ICD-10-CM

## 2018-07-17 DIAGNOSIS — Z136 Encounter for screening for cardiovascular disorders: Secondary | ICD-10-CM | POA: Diagnosis not present

## 2018-07-17 DIAGNOSIS — Z87891 Personal history of nicotine dependence: Secondary | ICD-10-CM | POA: Diagnosis not present

## 2019-03-14 ENCOUNTER — Ambulatory Visit: Payer: Medicare Other | Attending: Internal Medicine

## 2019-03-14 DIAGNOSIS — Z23 Encounter for immunization: Secondary | ICD-10-CM | POA: Insufficient documentation

## 2019-03-14 NOTE — Progress Notes (Signed)
   Covid-19 Vaccination Clinic  Name:  Christian Snyder    MRN: [id number] DOB: 1950/03/26  03/14/2019  Christian Snyder was observed post Covid-19 immunization for 15 minutes without incidence. He was provided with Vaccine Information Sheet and instruction to access the V-Safe system.   Christian Snyder was instructed to call 911 with any severe reactions post vaccine: Marland Kitchen Difficulty breathing  . Swelling of your face and throat  . A fast heartbeat  . A bad rash all over your body  . Dizziness and weakness    Immunizations Administered    Name Date Dose VIS Date Route   Pfizer COVID-19 Vaccine 03/14/2019  2:13 PM 0.3 mL 01/01/2019 Intramuscular   Manufacturer: [employer], [employer]   Lot: [id number]   NDC: [address]-8

## 2019-03-24 DIAGNOSIS — G4733 Obstructive sleep apnea (adult) (pediatric): Secondary | ICD-10-CM | POA: Diagnosis not present

## 2019-04-07 ENCOUNTER — Ambulatory Visit: Payer: Medicare Other | Attending: Internal Medicine

## 2019-04-07 DIAGNOSIS — Z23 Encounter for immunization: Secondary | ICD-10-CM

## 2019-04-07 NOTE — Progress Notes (Signed)
   Covid-19 Vaccination Clinic  Name:  Christian Snyder    MRN: [id number] DOB: [birth year]  04/07/2019  [name] was observed post Covid-19 immunization for 15 minutes without incident. He was provided with Vaccine Information Sheet and instruction to access the V-Safe system.   [name] was instructed to call 911 with any severe reactions post vaccine: Marland Kitchen Difficulty breathing  . Swelling of face and throat  . A fast heartbeat  . A bad rash all over body  . Dizziness and weakness   Immunizations Administered    Name Date Dose VIS Date Route   Pfizer COVID-19 Vaccine 04/07/2019 11:53 AM 0.3 mL 01/01/2019 Intramuscular   Manufacturer: [employer], [employer]   Lot: [id number]   NDC: [address]-2

## 2019-05-25 IMAGING — RF OPERATIVE RIGHT HIP WITH PELVIS
1 series · 10 of 10 positions shown · non-contrast
Comparison: MRI 04/29/2018.  Plain films of 04/15/2018.

CLINICAL DATA: Right hip pinning.

EXAM:
OPERATIVE RIGHT HIP (WITH PELVIS IF PERFORMED) 10 VIEWS
TECHNIQUE: Fluoroscopic spot image(s) were submitted for interpretation
post-operatively.

[Series 1: unknown protocol · 0.14mm/px · 10 of 10 slices shown]
[im 1/10]
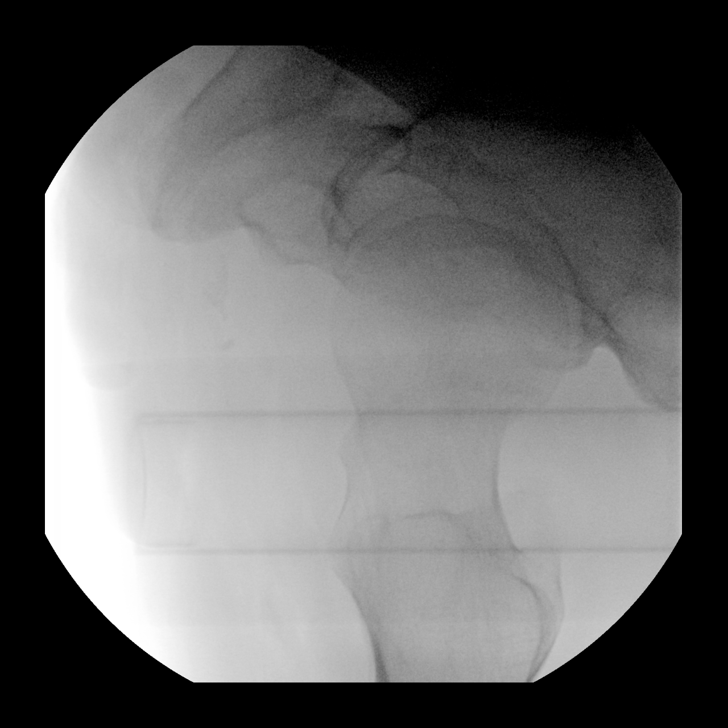
[im 2/10]
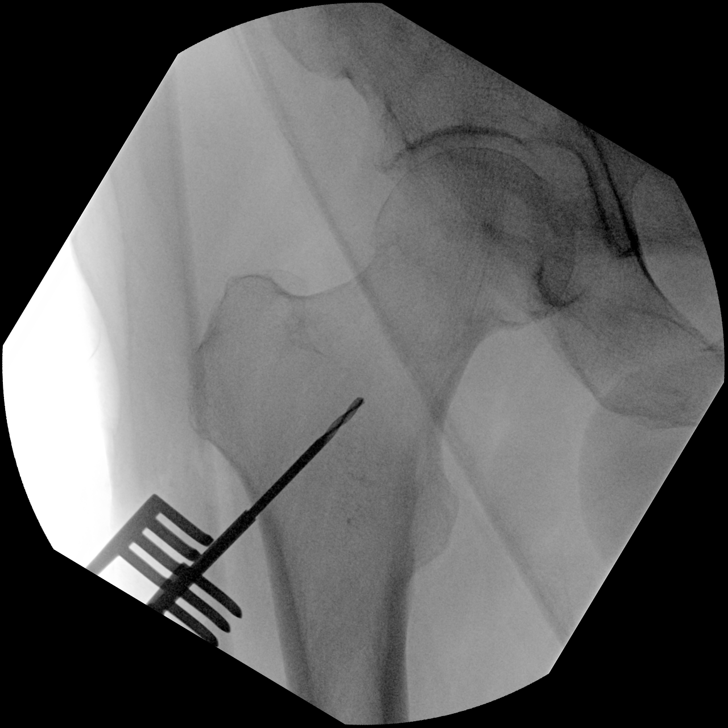
[im 3/10]
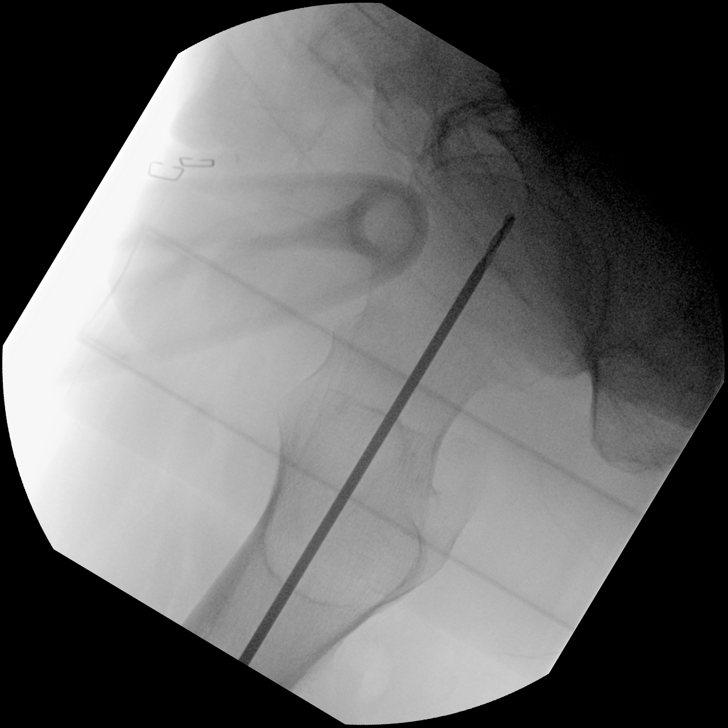
[im 4/10]
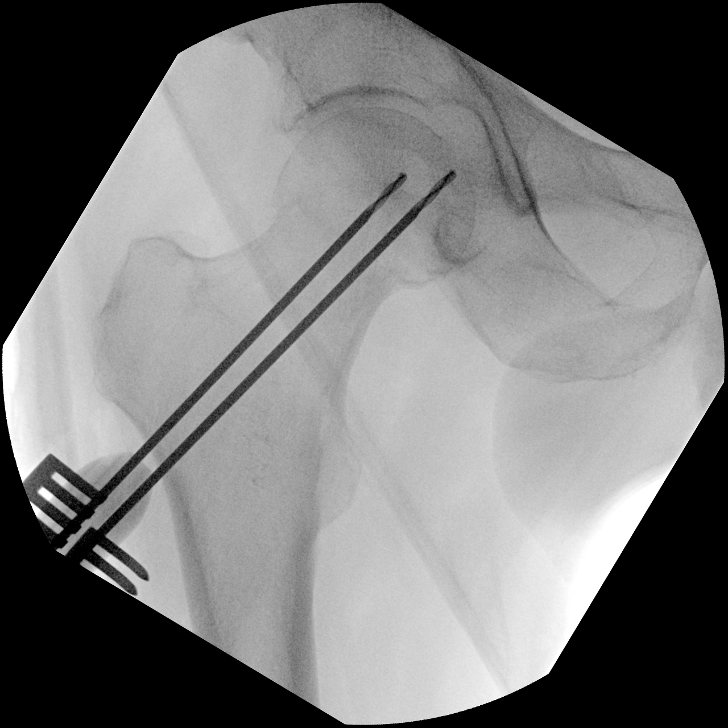
[im 5/10]
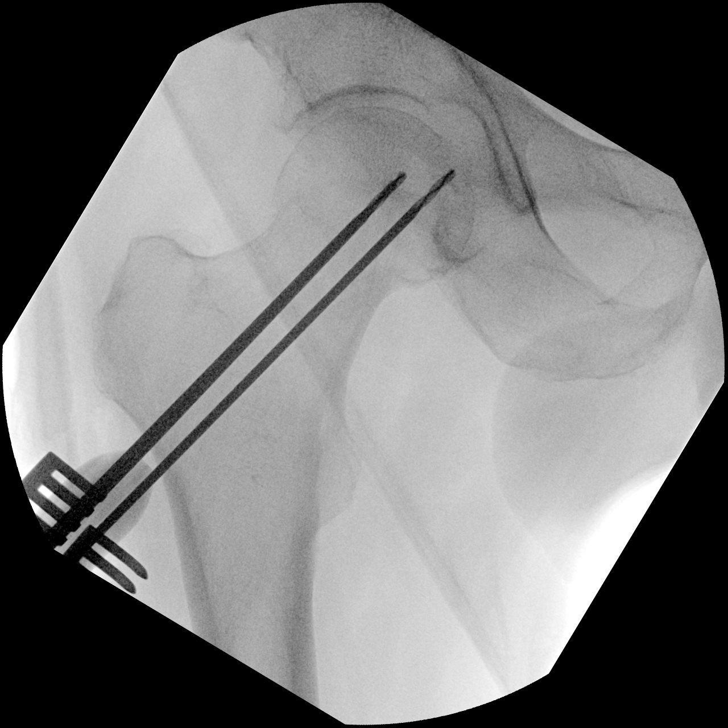
[im 6/10]
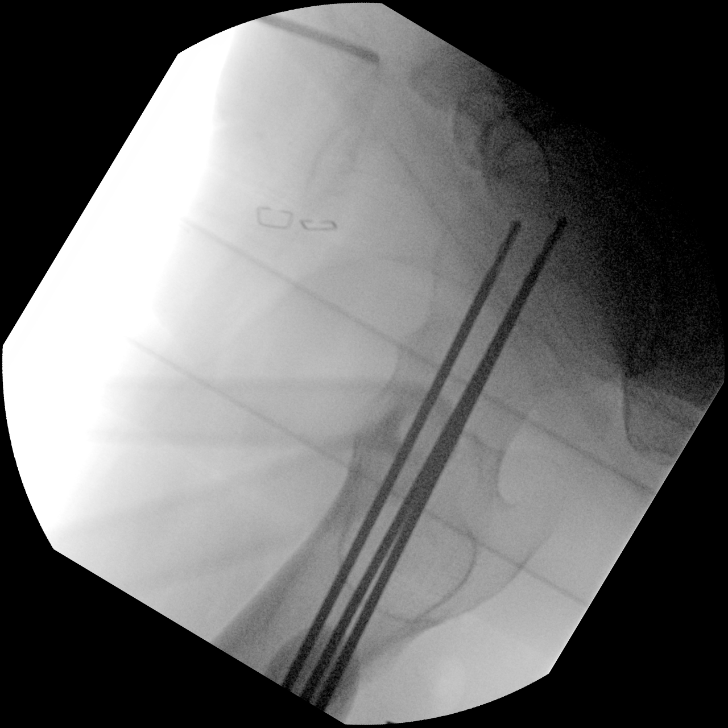
[im 7/10]
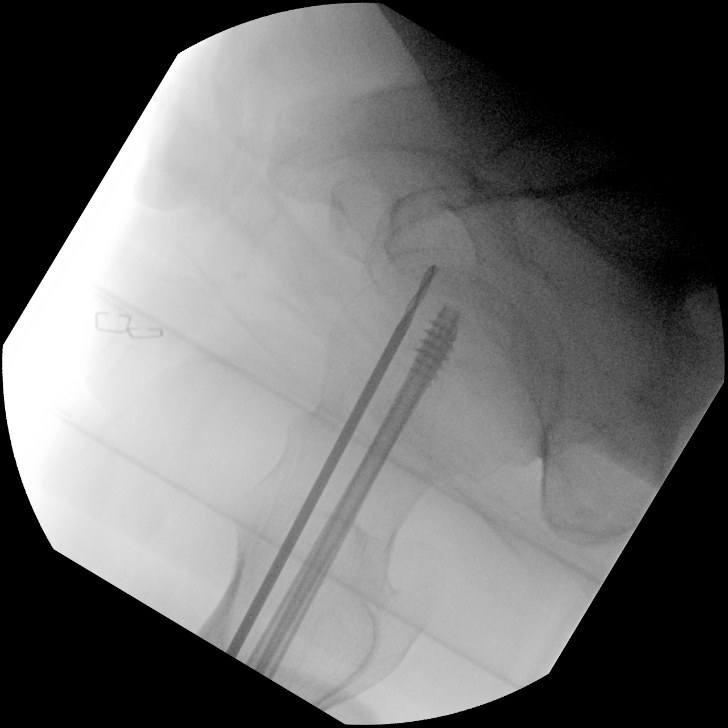
[im 8/10]
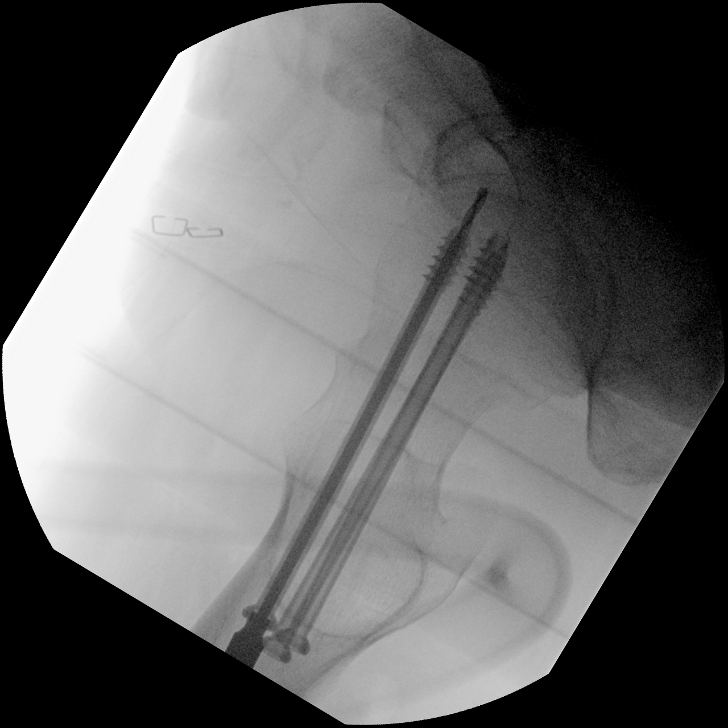
[im 9/10]
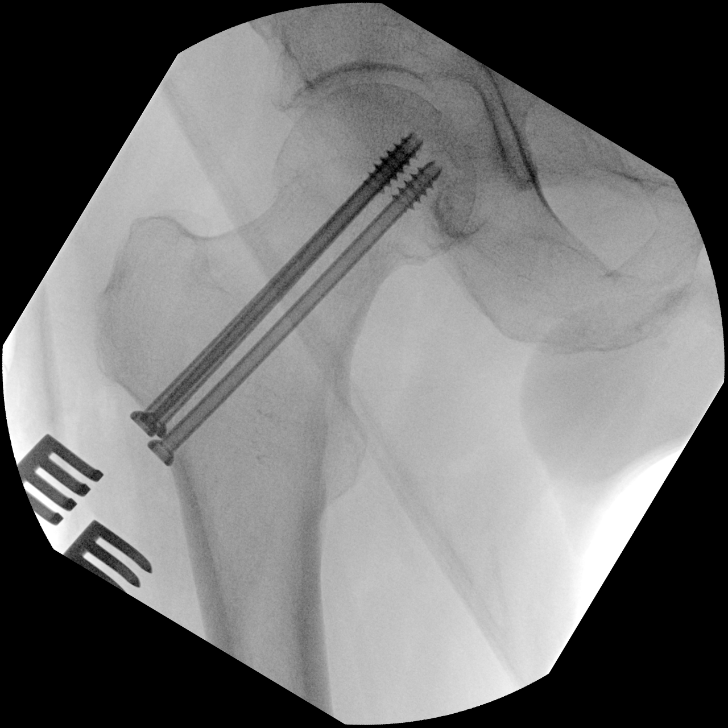
[im 10/10]
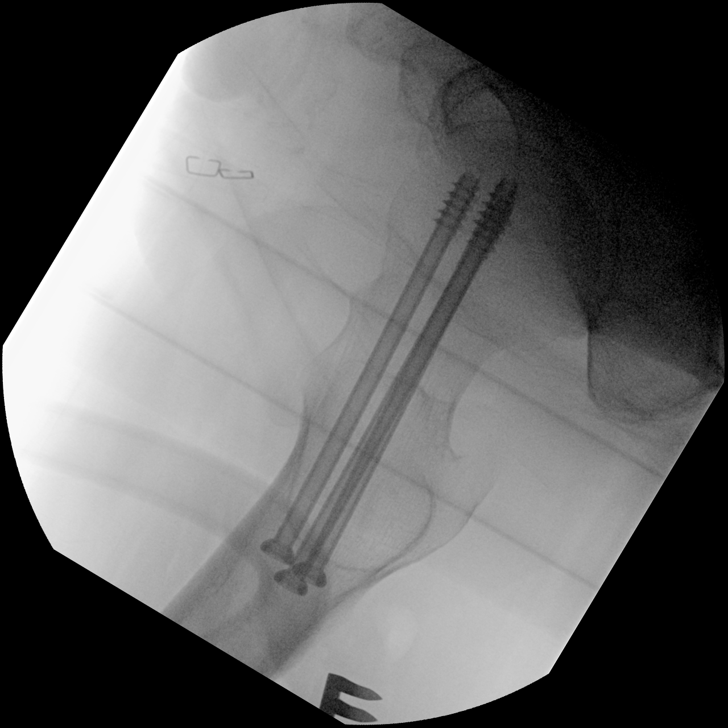

[10 of 10 positions shown; findings below may reference images not displayed]

FINDINGS: Multiple intraoperative views demonstrate placement of femoral neck
fixation screws. Alignment is anatomic, and no acute hardware
complication is identified.
IMPRESSION: Intraoperative imaging.

## 2019-08-10 IMAGING — US US ABDOMINAL AORTA SCREENING AAA
1 series · 14 of 20 positions shown · non-contrast
Comparison: None.

CLINICAL DATA: 68-year-old male with a history of smoking

EXAM:
ULTRASOUND OF ABDOMINAL AORTA
TECHNIQUE: Ultrasound examination of the abdominal aorta was performed to
evaluate for abdominal aortic aneurysm.

[Series 1: us abdominal aorta screening aaa · 0.28mm/px · 14 of 20 slices shown]
[im 1/20]
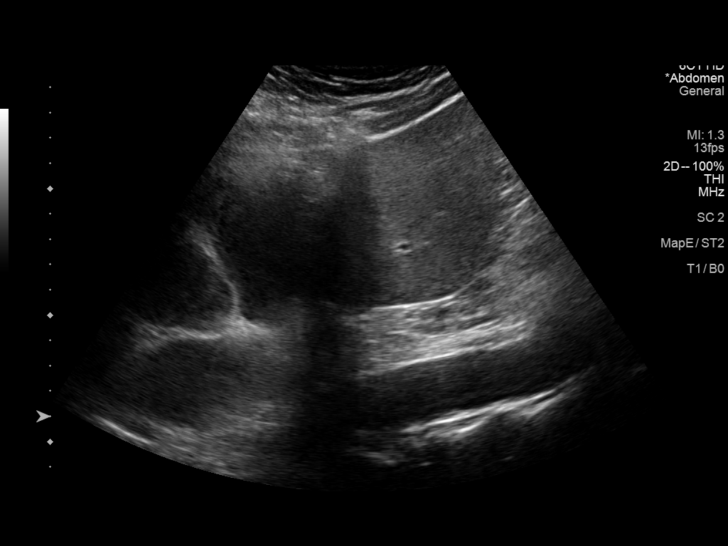
[im 3/20]
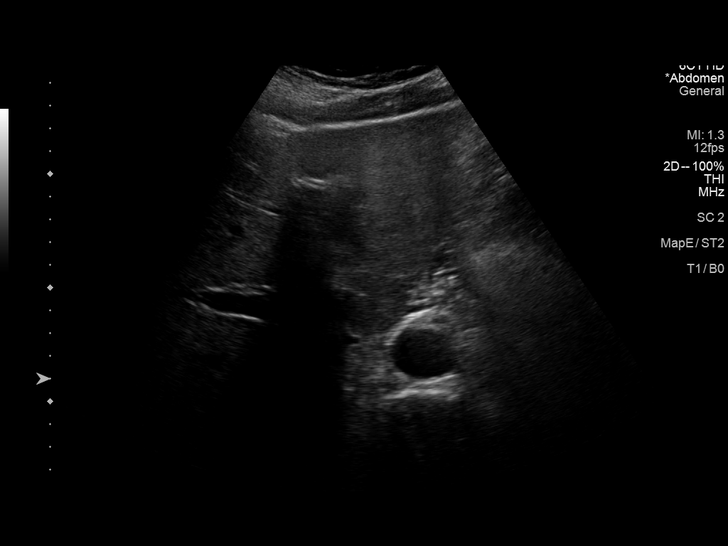
[im 4/20]
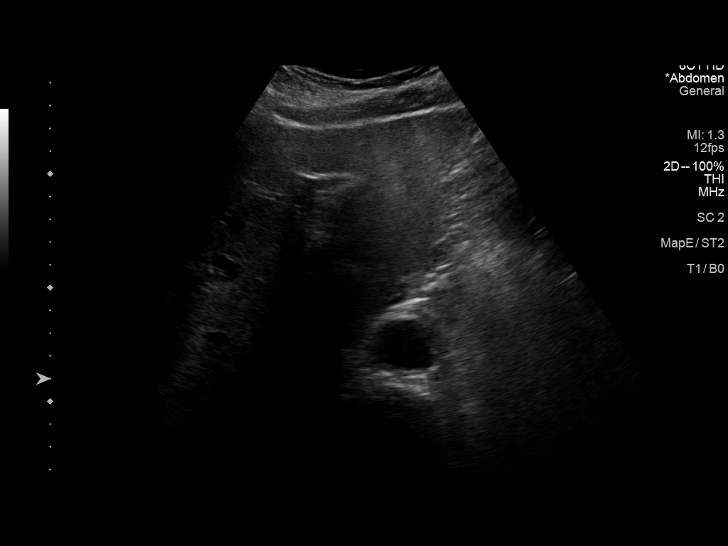
[im 6/20]
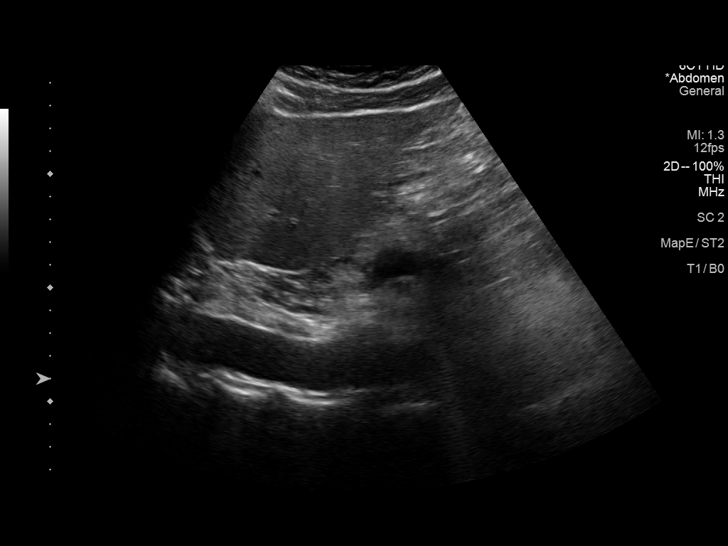
[im 7/20]
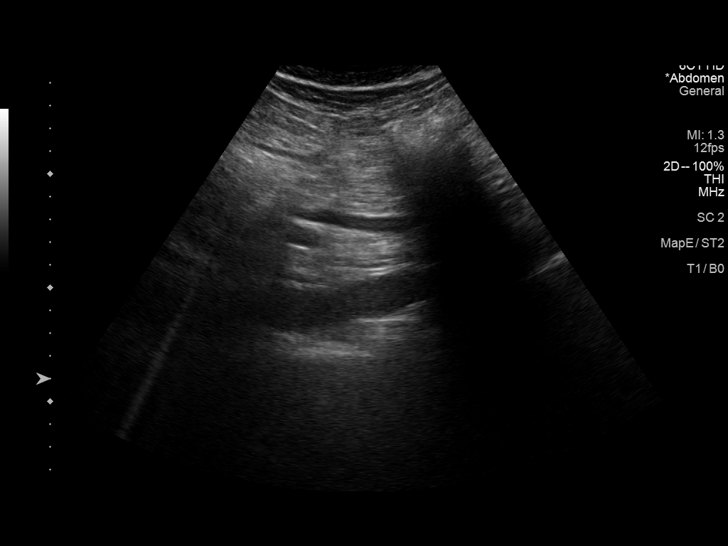
[im 8/20]
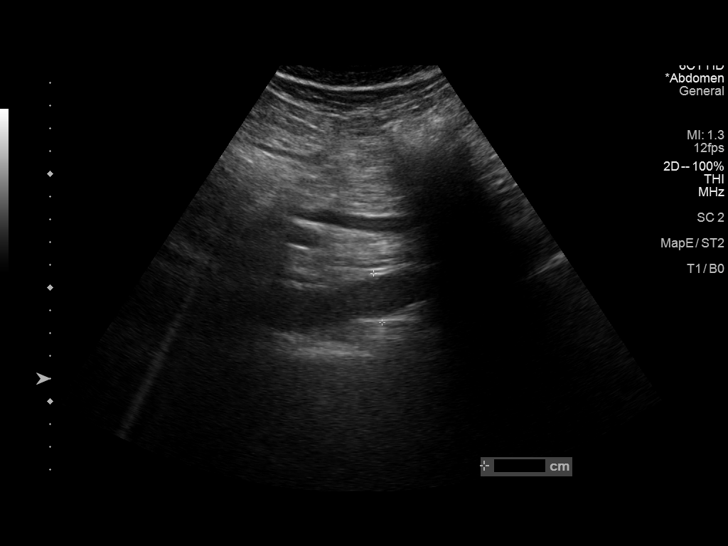
[im 10/20]
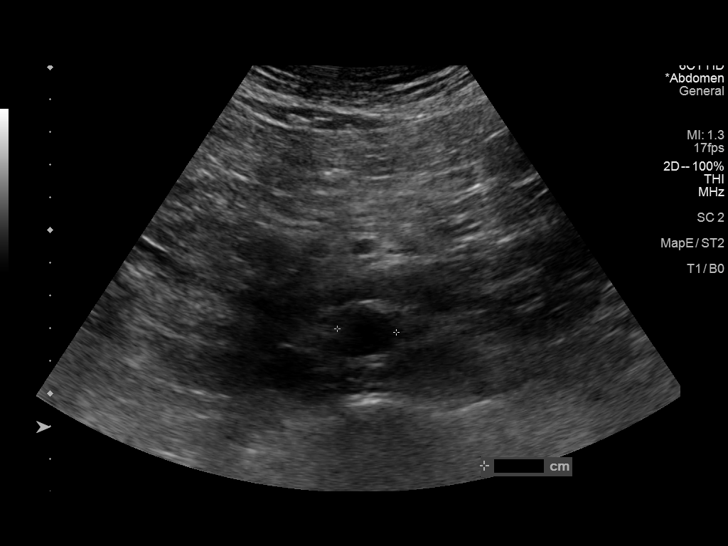
[im 11/20]
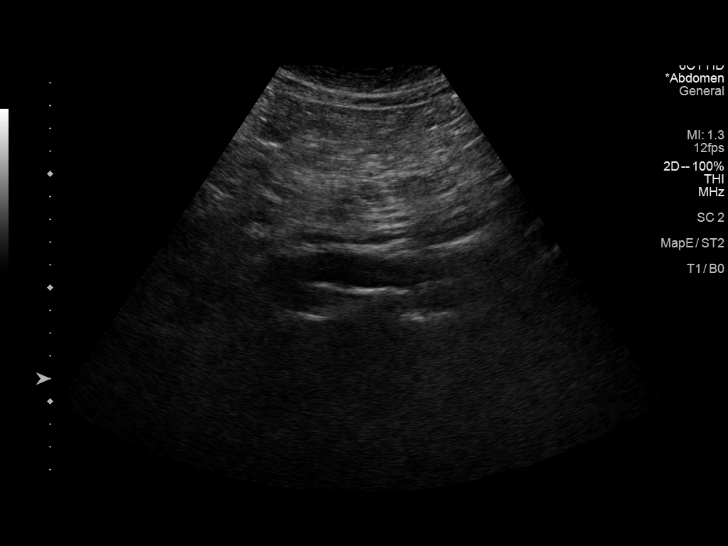
[im 13/20]
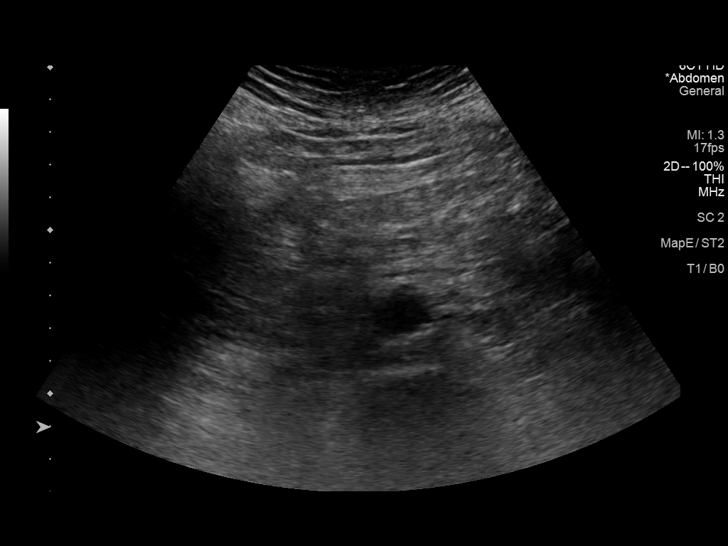
[im 14/20]
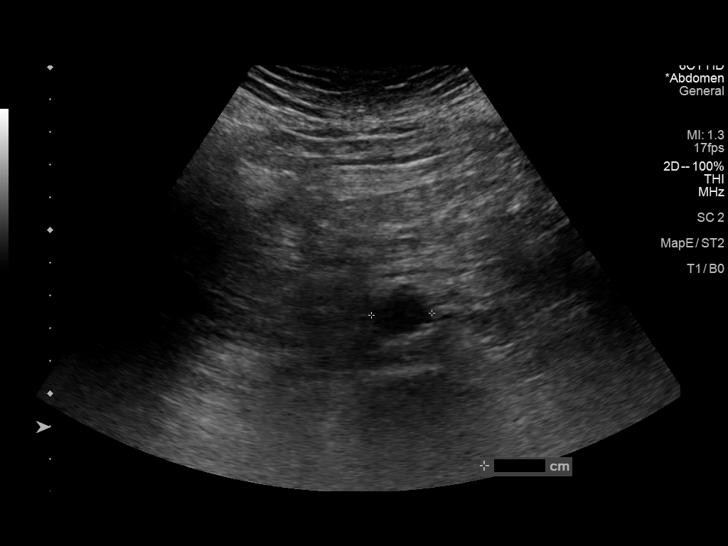
[im 16/20]
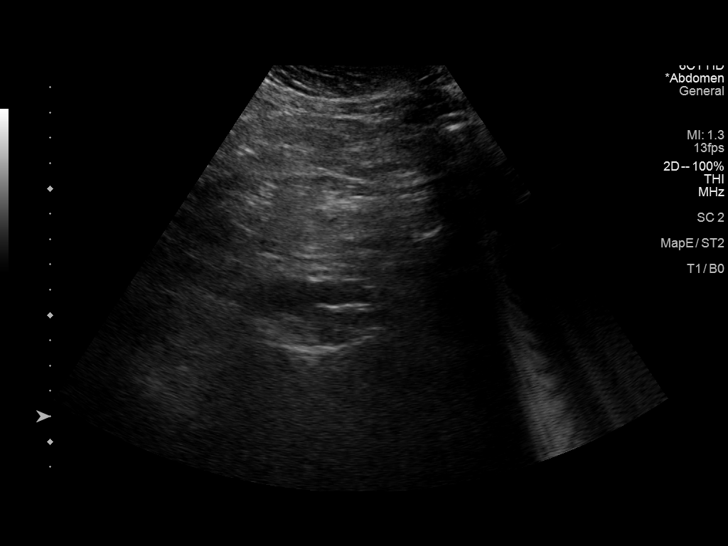
[im 17/20]
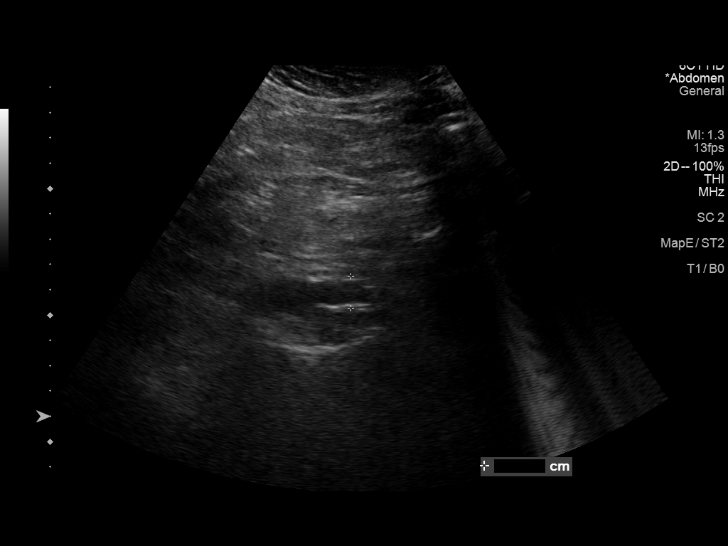
[im 18/20]
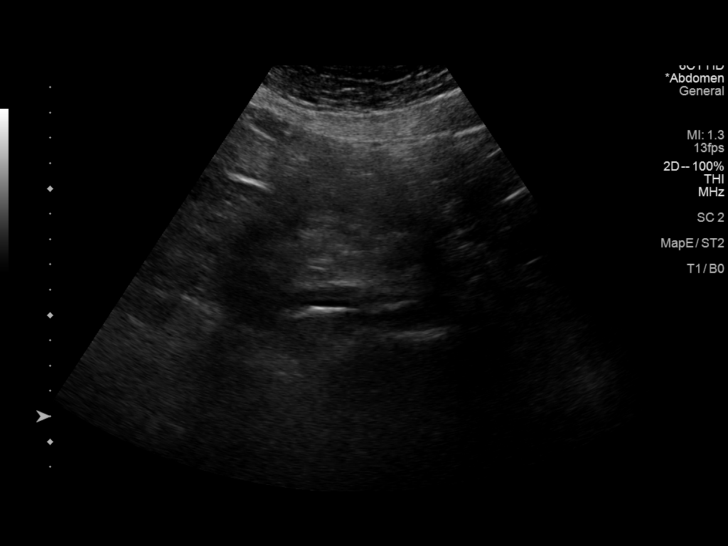
[im 20/20]
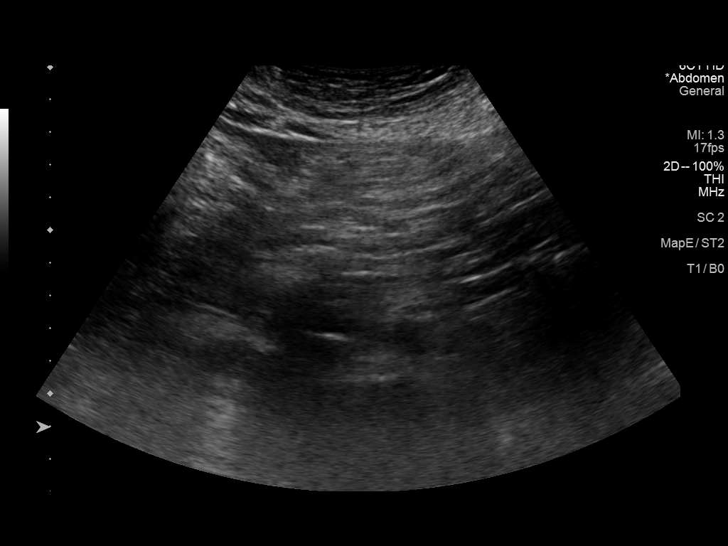

[14 of 20 positions shown; findings below may reference images not displayed]

FINDINGS: Abdominal aortic measurements as follows:

Proximal:  2.9 cm

Mid:  2.2 cm

Distal:  2.0 cm
IMPRESSION: Ectatic abdominal aorta at risk for aneurysm development. Recommend
followup by ultrasound in 5 years. This recommendation follows ACR
consensus guidelines: White Paper of the ACR Incidental Findings
Committee II on Vascular Findings. [HOSPITAL] 2479;

## 2019-11-17 DIAGNOSIS — E039 Hypothyroidism, unspecified: Secondary | ICD-10-CM | POA: Diagnosis not present

## 2019-11-17 DIAGNOSIS — I10 Essential (primary) hypertension: Secondary | ICD-10-CM | POA: Diagnosis not present

## 2019-11-17 DIAGNOSIS — G4733 Obstructive sleep apnea (adult) (pediatric): Secondary | ICD-10-CM | POA: Diagnosis not present

## 2019-11-17 DIAGNOSIS — E7800 Pure hypercholesterolemia, unspecified: Secondary | ICD-10-CM | POA: Diagnosis not present

## 2019-11-17 DIAGNOSIS — Z125 Encounter for screening for malignant neoplasm of prostate: Secondary | ICD-10-CM | POA: Diagnosis not present

## 2019-11-17 DIAGNOSIS — L570 Actinic keratosis: Secondary | ICD-10-CM | POA: Diagnosis not present

## 2019-11-17 DIAGNOSIS — F338 Other recurrent depressive disorders: Secondary | ICD-10-CM | POA: Diagnosis not present

## 2019-11-17 DIAGNOSIS — Z86010 Personal history of colonic polyps: Secondary | ICD-10-CM | POA: Diagnosis not present

## 2019-11-17 DIAGNOSIS — I77819 Aortic ectasia, unspecified site: Secondary | ICD-10-CM | POA: Diagnosis not present

## 2019-11-17 DIAGNOSIS — D649 Anemia, unspecified: Secondary | ICD-10-CM | POA: Diagnosis not present

## 2019-11-17 DIAGNOSIS — R7301 Impaired fasting glucose: Secondary | ICD-10-CM | POA: Diagnosis not present

## 2019-12-09 DIAGNOSIS — Z23 Encounter for immunization: Secondary | ICD-10-CM | POA: Diagnosis not present

## 2020-03-29 DIAGNOSIS — I10 Essential (primary) hypertension: Secondary | ICD-10-CM | POA: Diagnosis not present

## 2020-03-29 DIAGNOSIS — L089 Local infection of the skin and subcutaneous tissue, unspecified: Secondary | ICD-10-CM | POA: Diagnosis not present

## 2020-03-29 DIAGNOSIS — E039 Hypothyroidism, unspecified: Secondary | ICD-10-CM | POA: Diagnosis not present

## 2020-03-29 DIAGNOSIS — L729 Follicular cyst of the skin and subcutaneous tissue, unspecified: Secondary | ICD-10-CM | POA: Diagnosis not present

## 2020-03-31 DIAGNOSIS — L089 Local infection of the skin and subcutaneous tissue, unspecified: Secondary | ICD-10-CM | POA: Diagnosis not present

## 2020-03-31 DIAGNOSIS — L723 Sebaceous cyst: Secondary | ICD-10-CM | POA: Diagnosis not present

## 2020-04-12 DIAGNOSIS — G4733 Obstructive sleep apnea (adult) (pediatric): Secondary | ICD-10-CM | POA: Diagnosis not present

## 2020-05-24 DIAGNOSIS — Z1389 Encounter for screening for other disorder: Secondary | ICD-10-CM | POA: Diagnosis not present

## 2020-05-24 DIAGNOSIS — Z0000 Encounter for general adult medical examination without abnormal findings: Secondary | ICD-10-CM | POA: Diagnosis not present

## 2020-06-29 DIAGNOSIS — E039 Hypothyroidism, unspecified: Secondary | ICD-10-CM | POA: Diagnosis not present

## 2020-06-29 DIAGNOSIS — N1831 Chronic kidney disease, stage 3a: Secondary | ICD-10-CM | POA: Diagnosis not present

## 2020-06-29 DIAGNOSIS — F329 Major depressive disorder, single episode, unspecified: Secondary | ICD-10-CM | POA: Diagnosis not present

## 2020-06-29 DIAGNOSIS — I10 Essential (primary) hypertension: Secondary | ICD-10-CM | POA: Diagnosis not present

## 2020-06-29 DIAGNOSIS — E7800 Pure hypercholesterolemia, unspecified: Secondary | ICD-10-CM | POA: Diagnosis not present

## 2020-09-28 DIAGNOSIS — E039 Hypothyroidism, unspecified: Secondary | ICD-10-CM | POA: Diagnosis not present

## 2020-09-28 DIAGNOSIS — I10 Essential (primary) hypertension: Secondary | ICD-10-CM | POA: Diagnosis not present

## 2020-09-28 DIAGNOSIS — F329 Major depressive disorder, single episode, unspecified: Secondary | ICD-10-CM | POA: Diagnosis not present

## 2020-09-28 DIAGNOSIS — N1831 Chronic kidney disease, stage 3a: Secondary | ICD-10-CM | POA: Diagnosis not present

## 2020-09-28 DIAGNOSIS — E7800 Pure hypercholesterolemia, unspecified: Secondary | ICD-10-CM | POA: Diagnosis not present

## 2021-01-10 DIAGNOSIS — H2513 Age-related nuclear cataract, bilateral: Secondary | ICD-10-CM | POA: Diagnosis not present

## 2021-04-02 DIAGNOSIS — Z20822 Contact with and (suspected) exposure to covid-19: Secondary | ICD-10-CM | POA: Diagnosis not present

## 2021-04-11 DIAGNOSIS — G4733 Obstructive sleep apnea (adult) (pediatric): Secondary | ICD-10-CM | POA: Diagnosis not present

## 2021-04-29 DIAGNOSIS — Z20822 Contact with and (suspected) exposure to covid-19: Secondary | ICD-10-CM | POA: Diagnosis not present

## 2021-05-18 DIAGNOSIS — Z20822 Contact with and (suspected) exposure to covid-19: Secondary | ICD-10-CM | POA: Diagnosis not present

## 2021-05-23 DIAGNOSIS — Z20822 Contact with and (suspected) exposure to covid-19: Secondary | ICD-10-CM | POA: Diagnosis not present

## 2021-05-24 DIAGNOSIS — Z20822 Contact with and (suspected) exposure to covid-19: Secondary | ICD-10-CM | POA: Diagnosis not present

## 2021-05-25 DIAGNOSIS — Z20822 Contact with and (suspected) exposure to covid-19: Secondary | ICD-10-CM | POA: Diagnosis not present

## 2021-05-25 DIAGNOSIS — Z1389 Encounter for screening for other disorder: Secondary | ICD-10-CM | POA: Diagnosis not present

## 2021-05-25 DIAGNOSIS — Z0000 Encounter for general adult medical examination without abnormal findings: Secondary | ICD-10-CM | POA: Diagnosis not present

## 2021-05-25 DIAGNOSIS — Z23 Encounter for immunization: Secondary | ICD-10-CM | POA: Diagnosis not present

## 2021-05-30 DIAGNOSIS — Z20828 Contact with and (suspected) exposure to other viral communicable diseases: Secondary | ICD-10-CM | POA: Diagnosis not present

## 2021-07-19 DIAGNOSIS — G4733 Obstructive sleep apnea (adult) (pediatric): Secondary | ICD-10-CM | POA: Diagnosis not present

## 2021-09-05 DIAGNOSIS — F329 Major depressive disorder, single episode, unspecified: Secondary | ICD-10-CM | POA: Diagnosis not present

## 2021-09-05 DIAGNOSIS — L989 Disorder of the skin and subcutaneous tissue, unspecified: Secondary | ICD-10-CM | POA: Diagnosis not present

## 2021-09-05 DIAGNOSIS — N1831 Chronic kidney disease, stage 3a: Secondary | ICD-10-CM | POA: Diagnosis not present

## 2021-09-05 DIAGNOSIS — I10 Essential (primary) hypertension: Secondary | ICD-10-CM | POA: Diagnosis not present

## 2021-09-05 DIAGNOSIS — E7800 Pure hypercholesterolemia, unspecified: Secondary | ICD-10-CM | POA: Diagnosis not present

## 2021-09-05 DIAGNOSIS — Z125 Encounter for screening for malignant neoplasm of prostate: Secondary | ICD-10-CM | POA: Diagnosis not present

## 2021-09-05 DIAGNOSIS — Z23 Encounter for immunization: Secondary | ICD-10-CM | POA: Diagnosis not present

## 2021-09-05 DIAGNOSIS — E039 Hypothyroidism, unspecified: Secondary | ICD-10-CM | POA: Diagnosis not present

## 2021-09-26 DIAGNOSIS — L578 Other skin changes due to chronic exposure to nonionizing radiation: Secondary | ICD-10-CM | POA: Diagnosis not present

## 2021-09-26 DIAGNOSIS — L814 Other melanin hyperpigmentation: Secondary | ICD-10-CM | POA: Diagnosis not present

## 2021-09-26 DIAGNOSIS — L821 Other seborrheic keratosis: Secondary | ICD-10-CM | POA: Diagnosis not present

## 2021-10-17 DIAGNOSIS — G4733 Obstructive sleep apnea (adult) (pediatric): Secondary | ICD-10-CM | POA: Diagnosis not present

## 2022-03-25 DIAGNOSIS — N529 Male erectile dysfunction, unspecified: Secondary | ICD-10-CM | POA: Diagnosis not present

## 2022-03-25 DIAGNOSIS — E039 Hypothyroidism, unspecified: Secondary | ICD-10-CM | POA: Diagnosis not present

## 2022-03-25 DIAGNOSIS — R195 Other fecal abnormalities: Secondary | ICD-10-CM | POA: Diagnosis not present

## 2022-03-25 DIAGNOSIS — I10 Essential (primary) hypertension: Secondary | ICD-10-CM | POA: Diagnosis not present

## 2022-03-25 DIAGNOSIS — N1831 Chronic kidney disease, stage 3a: Secondary | ICD-10-CM | POA: Diagnosis not present

## 2022-03-25 DIAGNOSIS — Z6827 Body mass index (BMI) 27.0-27.9, adult: Secondary | ICD-10-CM | POA: Diagnosis not present

## 2022-03-25 DIAGNOSIS — F329 Major depressive disorder, single episode, unspecified: Secondary | ICD-10-CM | POA: Diagnosis not present

## 2022-03-25 DIAGNOSIS — E7800 Pure hypercholesterolemia, unspecified: Secondary | ICD-10-CM | POA: Diagnosis not present

## 2022-05-08 DIAGNOSIS — N5201 Erectile dysfunction due to arterial insufficiency: Secondary | ICD-10-CM | POA: Diagnosis not present

## 2022-05-08 DIAGNOSIS — N400 Enlarged prostate without lower urinary tract symptoms: Secondary | ICD-10-CM | POA: Diagnosis not present

## 2022-06-06 DIAGNOSIS — R7301 Impaired fasting glucose: Secondary | ICD-10-CM | POA: Diagnosis not present

## 2022-07-04 DIAGNOSIS — N5201 Erectile dysfunction due to arterial insufficiency: Secondary | ICD-10-CM | POA: Diagnosis not present

## 2022-07-11 DIAGNOSIS — K635 Polyp of colon: Secondary | ICD-10-CM | POA: Diagnosis not present

## 2022-07-11 DIAGNOSIS — K5730 Diverticulosis of large intestine without perforation or abscess without bleeding: Secondary | ICD-10-CM | POA: Diagnosis not present

## 2022-07-11 DIAGNOSIS — K648 Other hemorrhoids: Secondary | ICD-10-CM | POA: Diagnosis not present

## 2022-07-11 DIAGNOSIS — Z1211 Encounter for screening for malignant neoplasm of colon: Secondary | ICD-10-CM | POA: Diagnosis not present

## 2022-07-16 DIAGNOSIS — K635 Polyp of colon: Secondary | ICD-10-CM | POA: Diagnosis not present

## 2022-07-31 DIAGNOSIS — M7989 Other specified soft tissue disorders: Secondary | ICD-10-CM | POA: Diagnosis not present

## 2022-07-31 DIAGNOSIS — Z6827 Body mass index (BMI) 27.0-27.9, adult: Secondary | ICD-10-CM | POA: Diagnosis not present

## 2022-07-31 DIAGNOSIS — T63441A Toxic effect of venom of bees, accidental (unintentional), initial encounter: Secondary | ICD-10-CM | POA: Diagnosis not present

## 2022-10-30 DIAGNOSIS — G4733 Obstructive sleep apnea (adult) (pediatric): Secondary | ICD-10-CM | POA: Diagnosis not present

## 2023-04-02 DIAGNOSIS — E039 Hypothyroidism, unspecified: Secondary | ICD-10-CM | POA: Diagnosis not present

## 2023-04-02 DIAGNOSIS — N1831 Chronic kidney disease, stage 3a: Secondary | ICD-10-CM | POA: Diagnosis not present

## 2023-04-02 DIAGNOSIS — I10 Essential (primary) hypertension: Secondary | ICD-10-CM | POA: Diagnosis not present

## 2023-04-02 DIAGNOSIS — E7800 Pure hypercholesterolemia, unspecified: Secondary | ICD-10-CM | POA: Diagnosis not present

## 2023-05-27 DIAGNOSIS — E039 Hypothyroidism, unspecified: Secondary | ICD-10-CM | POA: Diagnosis not present

## 2023-06-13 DIAGNOSIS — M25562 Pain in left knee: Secondary | ICD-10-CM | POA: Diagnosis not present

## 2023-06-21 DIAGNOSIS — M25562 Pain in left knee: Secondary | ICD-10-CM | POA: Diagnosis not present

## 2023-07-04 DIAGNOSIS — M25562 Pain in left knee: Secondary | ICD-10-CM | POA: Diagnosis not present

## 2023-07-10 DIAGNOSIS — G8918 Other acute postprocedural pain: Secondary | ICD-10-CM | POA: Diagnosis not present

## 2023-07-10 DIAGNOSIS — S83242A Other tear of medial meniscus, current injury, left knee, initial encounter: Secondary | ICD-10-CM | POA: Diagnosis not present

## 2023-07-10 DIAGNOSIS — S83282A Other tear of lateral meniscus, current injury, left knee, initial encounter: Secondary | ICD-10-CM | POA: Diagnosis not present

## 2023-07-10 DIAGNOSIS — M2242 Chondromalacia patellae, left knee: Secondary | ICD-10-CM | POA: Diagnosis not present

## 2023-07-10 DIAGNOSIS — M2342 Loose body in knee, left knee: Secondary | ICD-10-CM | POA: Diagnosis not present

## 2023-07-14 DIAGNOSIS — S83282D Other tear of lateral meniscus, current injury, left knee, subsequent encounter: Secondary | ICD-10-CM | POA: Diagnosis not present

## 2023-07-14 DIAGNOSIS — S83242D Other tear of medial meniscus, current injury, left knee, subsequent encounter: Secondary | ICD-10-CM | POA: Diagnosis not present

## 2023-07-18 DIAGNOSIS — S83242D Other tear of medial meniscus, current injury, left knee, subsequent encounter: Secondary | ICD-10-CM | POA: Diagnosis not present

## 2023-07-18 DIAGNOSIS — S83282D Other tear of lateral meniscus, current injury, left knee, subsequent encounter: Secondary | ICD-10-CM | POA: Diagnosis not present

## 2023-07-24 DIAGNOSIS — S83282D Other tear of lateral meniscus, current injury, left knee, subsequent encounter: Secondary | ICD-10-CM | POA: Diagnosis not present

## 2023-07-24 DIAGNOSIS — S83242D Other tear of medial meniscus, current injury, left knee, subsequent encounter: Secondary | ICD-10-CM | POA: Diagnosis not present

## 2023-07-29 DIAGNOSIS — S83242D Other tear of medial meniscus, current injury, left knee, subsequent encounter: Secondary | ICD-10-CM | POA: Diagnosis not present

## 2023-07-29 DIAGNOSIS — S83282D Other tear of lateral meniscus, current injury, left knee, subsequent encounter: Secondary | ICD-10-CM | POA: Diagnosis not present

## 2023-08-05 DIAGNOSIS — S83242D Other tear of medial meniscus, current injury, left knee, subsequent encounter: Secondary | ICD-10-CM | POA: Diagnosis not present

## 2023-08-05 DIAGNOSIS — S83282D Other tear of lateral meniscus, current injury, left knee, subsequent encounter: Secondary | ICD-10-CM | POA: Diagnosis not present

## 2023-11-05 DIAGNOSIS — G4733 Obstructive sleep apnea (adult) (pediatric): Secondary | ICD-10-CM | POA: Diagnosis not present
